# Patient Record
Sex: Male | Born: 1965 | Race: White | Hispanic: No | Marital: Married | Smoking: Current every day smoker
Health system: Southern US, Community
[De-identification: ages and names within clinical notes are randomized; demographics above are authoritative.]

## PROBLEM LIST (undated history)

## (undated) DIAGNOSIS — G35 Multiple sclerosis: Secondary | ICD-10-CM

## (undated) DIAGNOSIS — R5383 Other fatigue: Secondary | ICD-10-CM

## (undated) HISTORY — DX: Multiple sclerosis: G35

## (undated) HISTORY — DX: Other fatigue: R53.83

## (undated) HISTORY — PX: BACK SURGERY: SHX140

---

## 2011-06-23 DIAGNOSIS — G35 Multiple sclerosis: Secondary | ICD-10-CM | POA: Diagnosis not present

## 2011-07-23 DIAGNOSIS — G35 Multiple sclerosis: Secondary | ICD-10-CM | POA: Diagnosis not present

## 2011-09-17 DIAGNOSIS — G35 Multiple sclerosis: Secondary | ICD-10-CM | POA: Diagnosis not present

## 2011-10-20 DIAGNOSIS — G35 Multiple sclerosis: Secondary | ICD-10-CM | POA: Diagnosis not present

## 2011-10-20 DIAGNOSIS — R5381 Other malaise: Secondary | ICD-10-CM | POA: Diagnosis not present

## 2011-11-24 DIAGNOSIS — G35 Multiple sclerosis: Secondary | ICD-10-CM | POA: Diagnosis not present

## 2011-12-22 DIAGNOSIS — G35 Multiple sclerosis: Secondary | ICD-10-CM | POA: Diagnosis not present

## 2012-01-18 DIAGNOSIS — G35 Multiple sclerosis: Secondary | ICD-10-CM | POA: Diagnosis not present

## 2012-01-22 DIAGNOSIS — L0291 Cutaneous abscess, unspecified: Secondary | ICD-10-CM | POA: Diagnosis not present

## 2012-01-22 DIAGNOSIS — L0390 Cellulitis, unspecified: Secondary | ICD-10-CM | POA: Diagnosis not present

## 2012-02-15 DIAGNOSIS — G35 Multiple sclerosis: Secondary | ICD-10-CM | POA: Diagnosis not present

## 2012-03-20 DIAGNOSIS — G35 Multiple sclerosis: Secondary | ICD-10-CM | POA: Diagnosis not present

## 2012-04-17 DIAGNOSIS — G35 Multiple sclerosis: Secondary | ICD-10-CM | POA: Diagnosis not present

## 2012-05-22 DIAGNOSIS — G35 Multiple sclerosis: Secondary | ICD-10-CM | POA: Diagnosis not present

## 2012-06-16 DIAGNOSIS — G35 Multiple sclerosis: Secondary | ICD-10-CM | POA: Diagnosis not present

## 2012-07-03 DIAGNOSIS — F411 Generalized anxiety disorder: Secondary | ICD-10-CM | POA: Diagnosis not present

## 2012-07-03 DIAGNOSIS — R209 Unspecified disturbances of skin sensation: Secondary | ICD-10-CM | POA: Diagnosis not present

## 2012-07-03 DIAGNOSIS — G35 Multiple sclerosis: Secondary | ICD-10-CM | POA: Diagnosis not present

## 2012-07-07 DIAGNOSIS — G35D Multiple sclerosis, unspecified: Secondary | ICD-10-CM | POA: Diagnosis not present

## 2012-07-07 DIAGNOSIS — G35 Multiple sclerosis: Secondary | ICD-10-CM | POA: Diagnosis not present

## 2012-07-07 DIAGNOSIS — R209 Unspecified disturbances of skin sensation: Secondary | ICD-10-CM | POA: Diagnosis not present

## 2012-07-07 DIAGNOSIS — M5030 Other cervical disc degeneration, unspecified cervical region: Secondary | ICD-10-CM | POA: Diagnosis not present

## 2012-07-17 DIAGNOSIS — G35 Multiple sclerosis: Secondary | ICD-10-CM | POA: Diagnosis not present

## 2012-08-14 DIAGNOSIS — G35 Multiple sclerosis: Secondary | ICD-10-CM | POA: Diagnosis not present

## 2012-08-21 DIAGNOSIS — M549 Dorsalgia, unspecified: Secondary | ICD-10-CM | POA: Diagnosis not present

## 2012-08-21 DIAGNOSIS — G8929 Other chronic pain: Secondary | ICD-10-CM | POA: Diagnosis not present

## 2012-08-21 DIAGNOSIS — F17200 Nicotine dependence, unspecified, uncomplicated: Secondary | ICD-10-CM | POA: Diagnosis not present

## 2012-08-21 DIAGNOSIS — G35 Multiple sclerosis: Secondary | ICD-10-CM | POA: Diagnosis not present

## 2012-08-23 DIAGNOSIS — F17200 Nicotine dependence, unspecified, uncomplicated: Secondary | ICD-10-CM | POA: Diagnosis not present

## 2012-08-23 DIAGNOSIS — A4902 Methicillin resistant Staphylococcus aureus infection, unspecified site: Secondary | ICD-10-CM | POA: Diagnosis not present

## 2012-08-23 DIAGNOSIS — Z5189 Encounter for other specified aftercare: Secondary | ICD-10-CM | POA: Diagnosis not present

## 2012-08-23 DIAGNOSIS — Z880 Allergy status to penicillin: Secondary | ICD-10-CM | POA: Diagnosis not present

## 2012-08-23 DIAGNOSIS — G35 Multiple sclerosis: Secondary | ICD-10-CM | POA: Diagnosis not present

## 2012-09-15 DIAGNOSIS — G35 Multiple sclerosis: Secondary | ICD-10-CM | POA: Diagnosis not present

## 2012-10-16 DIAGNOSIS — G35 Multiple sclerosis: Secondary | ICD-10-CM | POA: Diagnosis not present

## 2012-11-14 DIAGNOSIS — G35 Multiple sclerosis: Secondary | ICD-10-CM | POA: Diagnosis not present

## 2012-11-14 DIAGNOSIS — N529 Male erectile dysfunction, unspecified: Secondary | ICD-10-CM | POA: Diagnosis not present

## 2012-11-14 DIAGNOSIS — R5383 Other fatigue: Secondary | ICD-10-CM | POA: Diagnosis not present

## 2012-11-14 DIAGNOSIS — M47817 Spondylosis without myelopathy or radiculopathy, lumbosacral region: Secondary | ICD-10-CM | POA: Diagnosis not present

## 2012-12-13 DIAGNOSIS — G35 Multiple sclerosis: Secondary | ICD-10-CM | POA: Diagnosis not present

## 2013-01-16 DIAGNOSIS — G35 Multiple sclerosis: Secondary | ICD-10-CM | POA: Diagnosis not present

## 2013-02-02 DIAGNOSIS — R5381 Other malaise: Secondary | ICD-10-CM | POA: Diagnosis not present

## 2013-02-02 DIAGNOSIS — M47817 Spondylosis without myelopathy or radiculopathy, lumbosacral region: Secondary | ICD-10-CM | POA: Diagnosis not present

## 2013-02-02 DIAGNOSIS — G35 Multiple sclerosis: Secondary | ICD-10-CM | POA: Diagnosis not present

## 2013-02-14 DIAGNOSIS — G35 Multiple sclerosis: Secondary | ICD-10-CM | POA: Diagnosis not present

## 2013-03-19 DIAGNOSIS — R5381 Other malaise: Secondary | ICD-10-CM | POA: Diagnosis not present

## 2013-03-19 DIAGNOSIS — G35 Multiple sclerosis: Secondary | ICD-10-CM | POA: Diagnosis not present

## 2013-04-18 DIAGNOSIS — G35 Multiple sclerosis: Secondary | ICD-10-CM | POA: Diagnosis not present

## 2013-05-16 DIAGNOSIS — M47817 Spondylosis without myelopathy or radiculopathy, lumbosacral region: Secondary | ICD-10-CM | POA: Diagnosis not present

## 2013-05-16 DIAGNOSIS — G35 Multiple sclerosis: Secondary | ICD-10-CM | POA: Diagnosis not present

## 2013-05-16 DIAGNOSIS — R5381 Other malaise: Secondary | ICD-10-CM | POA: Diagnosis not present

## 2013-05-16 DIAGNOSIS — F411 Generalized anxiety disorder: Secondary | ICD-10-CM | POA: Diagnosis not present

## 2013-06-18 DIAGNOSIS — G35 Multiple sclerosis: Secondary | ICD-10-CM | POA: Diagnosis not present

## 2013-07-17 DIAGNOSIS — G35 Multiple sclerosis: Secondary | ICD-10-CM | POA: Diagnosis not present

## 2013-08-16 DIAGNOSIS — G35 Multiple sclerosis: Secondary | ICD-10-CM | POA: Diagnosis not present

## 2013-09-19 DIAGNOSIS — R5381 Other malaise: Secondary | ICD-10-CM | POA: Diagnosis not present

## 2013-09-19 DIAGNOSIS — G35 Multiple sclerosis: Secondary | ICD-10-CM | POA: Diagnosis not present

## 2013-09-19 DIAGNOSIS — M25559 Pain in unspecified hip: Secondary | ICD-10-CM | POA: Diagnosis not present

## 2013-09-19 DIAGNOSIS — M47817 Spondylosis without myelopathy or radiculopathy, lumbosacral region: Secondary | ICD-10-CM | POA: Diagnosis not present

## 2013-09-19 DIAGNOSIS — R5383 Other fatigue: Secondary | ICD-10-CM | POA: Diagnosis not present

## 2013-09-27 DIAGNOSIS — M25559 Pain in unspecified hip: Secondary | ICD-10-CM | POA: Diagnosis not present

## 2013-09-27 DIAGNOSIS — M1610 Unilateral primary osteoarthritis, unspecified hip: Secondary | ICD-10-CM | POA: Diagnosis not present

## 2013-09-27 DIAGNOSIS — M169 Osteoarthritis of hip, unspecified: Secondary | ICD-10-CM | POA: Diagnosis not present

## 2013-10-15 DIAGNOSIS — G35 Multiple sclerosis: Secondary | ICD-10-CM | POA: Diagnosis not present

## 2013-11-15 DIAGNOSIS — G35 Multiple sclerosis: Secondary | ICD-10-CM | POA: Diagnosis not present

## 2013-12-17 DIAGNOSIS — G35 Multiple sclerosis: Secondary | ICD-10-CM | POA: Diagnosis not present

## 2014-01-14 DIAGNOSIS — G35 Multiple sclerosis: Secondary | ICD-10-CM | POA: Diagnosis not present

## 2014-02-15 DIAGNOSIS — G35 Multiple sclerosis: Secondary | ICD-10-CM | POA: Diagnosis not present

## 2014-03-18 DIAGNOSIS — G35 Multiple sclerosis: Secondary | ICD-10-CM | POA: Diagnosis not present

## 2014-03-27 DIAGNOSIS — G35 Multiple sclerosis: Secondary | ICD-10-CM | POA: Diagnosis not present

## 2014-04-16 DIAGNOSIS — G35 Multiple sclerosis: Secondary | ICD-10-CM | POA: Diagnosis not present

## 2014-05-16 DIAGNOSIS — G35 Multiple sclerosis: Secondary | ICD-10-CM | POA: Diagnosis not present

## 2014-06-20 DIAGNOSIS — G35 Multiple sclerosis: Secondary | ICD-10-CM | POA: Diagnosis not present

## 2014-06-24 ENCOUNTER — Encounter: Payer: Self-pay | Admitting: Neurology

## 2014-06-24 ENCOUNTER — Ambulatory Visit (INDEPENDENT_AMBULATORY_CARE_PROVIDER_SITE_OTHER): Payer: Medicare Other | Admitting: Neurology

## 2014-06-24 VITALS — BP 154/92 | HR 74 | Resp 16 | Ht 68.0 in | Wt 153.0 lb

## 2014-06-24 DIAGNOSIS — M5441 Lumbago with sciatica, right side: Secondary | ICD-10-CM

## 2014-06-24 DIAGNOSIS — G35 Multiple sclerosis: Secondary | ICD-10-CM

## 2014-06-24 DIAGNOSIS — R5382 Chronic fatigue, unspecified: Secondary | ICD-10-CM

## 2014-06-24 DIAGNOSIS — R5383 Other fatigue: Secondary | ICD-10-CM | POA: Insufficient documentation

## 2014-06-24 DIAGNOSIS — R260 Ataxic gait: Secondary | ICD-10-CM | POA: Diagnosis not present

## 2014-06-24 DIAGNOSIS — F909 Attention-deficit hyperactivity disorder, unspecified type: Secondary | ICD-10-CM | POA: Diagnosis not present

## 2014-06-24 DIAGNOSIS — F988 Other specified behavioral and emotional disorders with onset usually occurring in childhood and adolescence: Secondary | ICD-10-CM

## 2014-06-24 HISTORY — DX: Other fatigue: R53.83

## 2014-06-24 HISTORY — DX: Multiple sclerosis: G35

## 2014-06-24 MED ORDER — METHYLPHENIDATE HCL 10 MG PO TABS: 10.0000 mg | ORAL_TABLET | Freq: Two times a day (BID) | ORAL | Status: DC

## 2014-06-24 NOTE — Progress Notes (Signed)
[facility]  PATIENT: Zachary Rasmussen DOB: 08/07/1965  REFERRING CLINICIAN: No primary care physician HISTORY FROM: Patient REASON FOR VISIT: Multiple sclerosis and worsening symptoms    HISTORICAL  CHIEF COMPLAINT:  Chief Complaint  Patient presents with  . Multiple Sclerosis    HISTORY OF PRESENT ILLNESS:  He is a 49 yo man with multiple sclerosis who was diagnosed about 10 years ago.   He presented initially with optic neuritis on the right. He got much better but continues to have some blurry vision out of the right eye. About the same time, he also began to notice that his gait was a little off balance. Although the vision has pretty much been stable, the gait has worsened over the years. He also has had worsening difficulties with attention and with verbal fluency and with memory.    He also notes numbness in the left face with tingling.    He reports difficulty with fatigue and gets benefit from IV steroid.   However, over the last year, the benefit has been less.   He got more benefit from Ritalin .  He did misuse it (taking higher doses in the past and then running out early) and it was discontinued a few years ago.     He was initially placed on Betaseron.   He has tolerated Betaseron well. Last year he switched to Plegridy to have fewer number of injections. While on these medicines he has not had any clearcut exacerbations though he has had some progression of his symptoms.    He has had more difficulty with coordination and with memory/speech lately.  He has fallen a few times.  He feels off balanced.    He notes numbness in the left face.  It feels like Novocaine wearing out.   He also has right foot numbness that reportedly appeared after lumbar spine surgery.    He denies numbness elsewhere.      He reports some depression at times but not bad enough to treat.   He denies any anxiety.      He notes some bladder urgency but has not had any  incontinence.   He denies nocturia or any UTI's.   No bowel issues.   He has had more issues with cognition noting poor attention and much more word finding difficulty.   This has slowly worsened over the past couple years.    REVIEW OF SYSTEMS:  Constitutional: No fevers, chills, sweats, or change in appetite Eyes: No new visual changes, double vision, eye pain Ear, nose and throat: No hearing loss, ear pain, nasal congestion, sore throat Cardiovascular: No chest pain, palpitations Respiratory:  No shortness of breath at rest or with exertion.   No wheezes GastrointestinaI: No nausea, vomiting, diarrhea, abdominal pain, fecal incontinence Genitourinary:  See above    No nocturia. Musculoskeletal:  No neck pan.   He continues to have back pain even after surgery.   Integumentary: No rash, pruritus, skin lesions Neurological: as above Psychiatric: see above. Endocrine: No palpitations, diaphoresis, change in appetite, change in weigh or increased thirst Hematologic/Lymphatic:  No anemia, purpura, petechiae. Allergic/Immunologic: No itchy/runny eyes, nasal congestion, recent allergic reactions, rashes  ALLERGIES: Allergies  Allergen Reactions  . Penicillins Rash    HOME MEDICATIONS: See Med List  PAST MEDICAL HISTORY: Past Medical History  Diagnosis Date  . Multiple sclerosis 06/24/2014  . Fatigue 06/24/2014    PAST SURGICAL HISTORY: Past Surgical History  Procedure Laterality Date  . Back  surgery      FAMILY HISTORY: Family History  Problem Relation Age of Onset  . Rheum arthritis Father   . Asthma Son   . Cancer Paternal Grandfather   . Heart failure Son   . Hyperlipidemia Father   . Hypertension Father   . Hypertension Mother   . Thyroid disease Neg Hx   . Stroke Neg Hx   . Seizures Neg Hx   . Rashes / Skin problems Neg Hx   . Migraines Neg Hx   . Diabetes Neg Hx     SOCIAL HISTORY:  History   Social History  . Marital Status: Single    Spouse Name:  N/A    Number of Children: N/A  . Years of Education: N/A   Occupational History  . Not on file.   Social History Main Topics  . Smoking status: Current Every Day Smoker -- 0.75 packs/day for 35 years    Types: Cigarettes  . Smokeless tobacco: Not on file  . Alcohol Use: Not on file  . Drug Use: No  . Sexual Activity: Not on file   Other Topics Concern  . Not on file   Social History Narrative  . No narrative on file     PHYSICAL EXAM  Filed Vitals:   06/24/14 1446  BP: 154/92  Pulse: 74  Resp: 16  Height:  (1.727 m)  Weight: 153 lb (69.4 kg)    Body mass index is 23.27 kg/(m^2).   General: The patient is well-developed and well-nourished and in no acute distress  Eyes:  Funduscopic exam shows normal optic discs and retinal vessels.  Neck: The neck is supple, no carotid bruits are noted.  The neck is nontender.  Back:  He is postoperative. Range of motion is normal. He has tenderness in the lower lumbar paraspinal region.  Respiratory: The respiratory examination is clear.  Cardiovascular: The cardiovascular examination reveals a regular rate and rhythm, no murmurs, gallops or rubs are noted.  Skin: Extremities are without significant edema.  Neurologic Exam  Mental status: The patient is alert and oriented x 3 at the time of the examination. The patient has apparent normal recent and remote memory, with an apparently normal attention span and concentration ability.   Speech is normal.  Cranial nerves: Extraocular movements are full. Pupils are equal, round, and reactive to light and accomodation.  Visual fields are full.  Facial symmetry is present. There is good facial sensation to soft touch bilaterally.Facial strength is normal.  Trapezius and sternocleidomastoid strength is normal. No dysarthria is noted.  The tongue is midline, and the patient has symmetric elevation of the soft palate. No obvious hearing deficits are noted.  Motor:  Muscle bulk is  normal. Tone is mildly increased in the legs. Strength is  5 / 5 in all 4 extremities.   Sensory: Sensory testing is intact to pinprick, soft touch, vibration sensation, and position sense on all 4 extremities.  Coordination: Cerebellar testing reveals good finger-nose-finger and heel-to-shin bilaterally.  Gait and station: Station is stable.  The gait is mildly wide and the tandem gait is moderately wide.. Romberg is negative.   Reflexes: Deep tendon reflexes are symmetric and normal bilaterally. Plantar responses are normal.    DIAGNOSTIC DATA (LABS, IMAGING, TESTING) - I reviewed patient records, labs, notes, testing and imaging myself where available.     ASSESSMENT AND PLAN  In summary, Mr. Barton is a 49 year old man with multiple sclerosis for the past 10 years.  His main impairments include an off-balance gait and fatigue. More recently he has also had cognitive issues that have progressed.  I will try to get the actual MRI images from last year with a report as these were not available at the time of the visit. He has shown significant progression, switching from Plegridy to another agent would be reasonable. We briefly discussed the oral agents as he would prefer to go to a pill if he needs to switch. In the past, his fatigue and cognitive attentional defects improved with Ritalin. We had a long discussion about his misuse of medication in the past. At that time, he had a log of personal issues with his family and he feels he is in a much better place at this point. His wife will be able to oversee his medicine use. I will give him one more chance and have prescribed Ritalin 10 mg by mouth twice a day. He just received a prescription for his oxycodone for his lower back pain and he will continue that as well.  He will return to see [place] in 4 months, sooner if he has more MS related issues. He was also advised to find a primary care physician  Richard A. Epimenio Foot, MD, PhD 06/24/2014, 3:22  PM Certified in Neurology, Clinical Neurophysiology, Sleep Medicine, Pain Medicine and Neuroimaging  [facility] [address] [place], [place] [address] [phone]

## 2014-07-17 ENCOUNTER — Telehealth: Payer: Self-pay

## 2014-07-17 ENCOUNTER — Other Ambulatory Visit: Payer: Self-pay | Admitting: Neurology

## 2014-07-17 MED ORDER — OXYCODONE-ACETAMINOPHEN 10-325 MG PO TABS: 1.0000 | ORAL_TABLET | ORAL | Status: DC | PRN

## 2014-07-17 NOTE — Telephone Encounter (Signed)
Patient states he needs Rx refill for oxycodone and ritalin. I went over last OV plan with patient and offered to schedule 4 month office visit. Patient insisted message given to Faith and request she calls back with refill and appt info because he is currently driving.

## 2014-07-17 NOTE — Telephone Encounter (Signed)
Spoke with Zachary Rasmussen and advised that 1--IV steroids should be limited as much as possible to preserve bone health, and 2--per [facility], oxycodone is due 07-20-14--rx will be up front on or after that day for him to pick up.  2-6 is a Saturday--pt is aware [facility] is not open on Saturdays, so rx would be available the following Monday--07-22-14.  He is agreeable with this/fim

## 2014-07-24 ENCOUNTER — Other Ambulatory Visit: Payer: Self-pay | Admitting: Neurology

## 2014-07-24 MED ORDER — METHYLPHENIDATE HCL 10 MG PO TABS: 10.0000 mg | ORAL_TABLET | Freq: Two times a day (BID) | ORAL | Status: DC

## 2014-07-24 NOTE — Telephone Encounter (Signed)
Pt is calling requesting a written Rx for methylphenidate (RITALIN) 10 MG tablet. Please call when ready for pick up. °

## 2014-07-24 NOTE — Telephone Encounter (Signed)
Request entered, forwarded to provider for approval.  

## 2014-07-25 ENCOUNTER — Telehealth: Payer: Self-pay | Admitting: *Deleted

## 2014-07-25 NOTE — Telephone Encounter (Signed)
LMOM that Ritalin rx. is up front, ready to be p/u tomorrow.  Advised office closes at noon on Fridays/fim

## 2014-07-25 NOTE — Telephone Encounter (Signed)
Patient is calling to get a written Rx for Ritalin.

## 2014-08-19 ENCOUNTER — Other Ambulatory Visit: Payer: Self-pay | Admitting: Neurology

## 2014-08-19 MED ORDER — METHYLPHENIDATE HCL 10 MG PO TABS: 10.0000 mg | ORAL_TABLET | Freq: Two times a day (BID) | ORAL | Status: DC

## 2014-08-19 MED ORDER — OXYCODONE-ACETAMINOPHEN 10-325 MG PO TABS: 1.0000 | ORAL_TABLET | ORAL | Status: DC | PRN

## 2014-08-19 NOTE — Telephone Encounter (Signed)
Pt calling to request refill for oxyCODONE-acetaminophen (PERCOCET) 10-325 MG per tablet and methylphenidate (RITALIN) 10 MG tablet. Please call when ready for pick up.

## 2014-08-20 ENCOUNTER — Telehealth: Payer: Self-pay

## 2014-08-20 NOTE — Telephone Encounter (Signed)
Called patient and informed Rx ready for pick up at front desk. Patient verbalized understanding.  

## 2014-09-16 ENCOUNTER — Telehealth: Payer: Self-pay | Admitting: Neurology

## 2014-09-16 NOTE — Telephone Encounter (Signed)
Patient stated PA is needed for Rx Peginterferon Beta-1a (PLEGRIDY) 125 MCG/0.5ML SOPN.  Please call and advise.

## 2014-09-16 NOTE — Telephone Encounter (Signed)
Ins has been contacted and provided with clinical info.  Request is currently under review Ref Key: [id number].  I called the patient back to advise.  He is aware.

## 2014-09-17 ENCOUNTER — Telehealth: Payer: Self-pay

## 2014-09-17 NOTE — Telephone Encounter (Signed)
[employer] has approved the request for coverage on Plegridy effective until 09/16/2016 Ref # [id number]

## 2014-09-18 ENCOUNTER — Telehealth: Payer: Self-pay

## 2014-09-18 MED ORDER — OXYCODONE-ACETAMINOPHEN 10-325 MG PO TABS: 1.0000 | ORAL_TABLET | ORAL | Status: DC | PRN

## 2014-09-18 MED ORDER — METHYLPHENIDATE HCL 10 MG PO TABS: 10.0000 mg | ORAL_TABLET | Freq: Two times a day (BID) | ORAL | Status: DC

## 2014-09-18 NOTE — Telephone Encounter (Signed)
Patient is requesting a refill on Percocet and Ritalin.  As well, he would like to discuss scheduling a follow up appt.  Thank you.

## 2014-09-18 NOTE — Telephone Encounter (Signed)
Spoke with Iantha Fallen and sched. f/u appt. next week per his request.  Advised rx's have been printed, signed, are up front [facility] to be picked up/fim

## 2014-09-24 ENCOUNTER — Encounter: Payer: Self-pay | Admitting: Neurology

## 2014-09-24 ENCOUNTER — Ambulatory Visit (INDEPENDENT_AMBULATORY_CARE_PROVIDER_SITE_OTHER): Payer: Medicare Other | Admitting: Neurology

## 2014-09-24 VITALS — BP 140/88 | HR 66 | Resp 14 | Ht 68.0 in | Wt 142.2 lb

## 2014-09-24 DIAGNOSIS — F09 Unspecified mental disorder due to known physiological condition: Secondary | ICD-10-CM

## 2014-09-24 DIAGNOSIS — G35 Multiple sclerosis: Secondary | ICD-10-CM | POA: Diagnosis not present

## 2014-09-24 DIAGNOSIS — M545 Low back pain, unspecified: Secondary | ICD-10-CM | POA: Insufficient documentation

## 2014-09-24 DIAGNOSIS — R5383 Other fatigue: Secondary | ICD-10-CM | POA: Diagnosis not present

## 2014-09-24 DIAGNOSIS — R260 Ataxic gait: Secondary | ICD-10-CM | POA: Insufficient documentation

## 2014-09-24 DIAGNOSIS — F418 Other specified anxiety disorders: Secondary | ICD-10-CM | POA: Diagnosis not present

## 2014-09-24 DIAGNOSIS — R200 Anesthesia of skin: Secondary | ICD-10-CM | POA: Insufficient documentation

## 2014-09-24 MED ORDER — METHYLPHENIDATE HCL 10 MG PO TABS: 10.0000 mg | ORAL_TABLET | Freq: Three times a day (TID) | ORAL | Status: DC | PRN

## 2014-09-24 MED ORDER — OXYCODONE-ACETAMINOPHEN 10-325 MG PO TABS: 1.0000 | ORAL_TABLET | ORAL | Status: DC | PRN

## 2014-09-24 NOTE — Progress Notes (Signed)
[facility]  PATIENT: Zachary Rasmussen DOB: 01/08/1966  REFERRING CLINICIAN: No primary care physician HISTORY FROM: Patient REASON FOR VISIT: Multiple sclerosis and worsening symptoms    HISTORICAL  CHIEF COMPLAINT:  Chief Complaint  Patient presents with  . Multiple Sclerosis    Sts. he is out of Plegridy--so far only 2 days late--due to lack of funding.  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  Sts. no change in MS sx./fim    HISTORY OF PRESENT ILLNESS:  He is a 49 yo man with multiple sclerosis who was diagnosed about 10 years ago.   He presented initially with optic neuritis on the right. He got much better but continues to have some blurry vision out of the right eye. About the same time, he also began to notice that his gait was a little off balance. Although the vision has pretty much been stable, the gait has worsened over the years. He also has had worsening difficulties with attention and with verbal fluency and with memory.    He also notes numbness in the left face with tingling.    He reports difficulty with fatigue and gets benefit from IV steroid.   However, over the last year, the benefit has been less.   He got more benefit from Ritalin .  He did misuse it (taking higher doses in the past and then running out early) and it was discontinued a few years ago.   He was initially placed on Betaseron.   He has tolerated Betaseron well. Last year he switched to Plegridy to have fewer number of injections. While on these medicines he has not had any clearcut exacerbations though he has had some progression of his symptoms.    New issues:    He notes his voice is more slurred lately.    This seems worse in the afternoons and evenings.     Gait/strength/sensation:   He feels gait does ok most of the time but balance is off a bit.   He stumbles at times but does not fall.    He has had more difficulty with coordination at times.   Feels strength is  usually good.   He notes numbness in the left face.  It feels like Novocaine wearing out.   He also has right foot numbness that reportedly appeared after lumbar spine surgery.    He denies numbness elsewhere.      Vision:    He notes vision has slowly declined over the past few years.   Has not seen ophthalmologist x 5 or more years.     Fatigue/sleep:    He notes more difficulty with fatigue, especially in the afternoons when he has more trouble getting tasks started and completed.    Fatigue is both physical and mental.   He feels he gets some some benefit from ritalin  Bladder:   He denies hesitancy but has mild frequency.    He denies nocturia or any UTI's.   No bowel issues.   Mood/Cognitoin:   He reports mild depression at times but not bad enough to treat.   He notes more anxiety lately having financial issues and less ability to do anything about it.    He has had more issues with cognition noting poor attention and much more word finding difficulty.   He feels ritalin has helped the cognitive issues during the morning but has worn off by the afternoon.  Pain: He continues to report moderate  pain in the lower back more than the neck. He feels that oxycodone has significantly helped the pain.  REVIEW OF SYSTEMS:  Constitutional: No fevers, chills, sweats, or change in appetite Eyes: No new visual changes, double vision, eye pain Ear, nose and throat: No hearing loss, ear pain, nasal congestion, sore throat Cardiovascular: No chest pain, palpitations Respiratory:  No shortness of breath at rest or with exertion.   No wheezes GastrointestinaI: No nausea, vomiting, diarrhea, abdominal pain, fecal incontinence Genitourinary:  See above    No nocturia. Musculoskeletal:  No neck pan.   He continues to have back pain even after surgery.   Integumentary: No rash, pruritus, skin lesions Neurological: as above Psychiatric: see above. Endocrine: No palpitations, diaphoresis, change in appetite,  change in weigh or increased thirst Hematologic/Lymphatic:  No anemia, purpura, petechiae. Allergic/Immunologic: No itchy/runny eyes, nasal congestion, recent allergic reactions, rashes  ALLERGIES: Allergies  Allergen Reactions  . Penicillins Rash    HOME MEDICATIONS: See Med List  PAST MEDICAL HISTORY: Past Medical History  Diagnosis Date  . Multiple sclerosis 06/24/2014  . Fatigue 06/24/2014    PAST SURGICAL HISTORY: Past Surgical History  Procedure Laterality Date  . Back surgery      FAMILY HISTORY: Family History  Problem Relation Age of Onset  . Rheum arthritis Father   . Asthma Son   . Cancer Paternal Grandfather   . Heart failure Son   . Hyperlipidemia Father   . Hypertension Father   . Hypertension Mother   . Thyroid disease Neg Hx   . Stroke Neg Hx   . Seizures Neg Hx   . Rashes / Skin problems Neg Hx   . Migraines Neg Hx   . Diabetes Neg Hx     SOCIAL HISTORY:  History   Social History  . Marital Status: Single    Spouse Name: N/A  . Number of Children: N/A  . Years of Education: N/A   Occupational History  . Not on file.   Social History Main Topics  . Smoking status: Current Every Day Smoker -- 0.75 packs/day for 35 years    Types: Cigarettes  . Smokeless tobacco: Not on file  . Alcohol Use: Not on file  . Drug Use: No  . Sexual Activity: Not on file   Other Topics Concern  . Not on file   Social History Narrative     PHYSICAL EXAM  Filed Vitals:   09/24/14 1320  BP: 140/88  Pulse: 66  Resp: 14  Height:  (1.727 m)  Weight: 142 lb 3.2 oz (64.501 kg)    Body mass index is 21.63 kg/(m^2).   General: The patient is well-developed and well-nourished and in no acute distress  Neck: The neck is supple, no carotid bruits are noted.  The neck is nontender.  Back:  He is postoperative. Range of motion is normal. He has tenderness in the lower lumbar paraspinal region.  Neurologic Exam  Mental status: The patient is  alert and oriented x 3 at the time of the examination. The patient has apparent normal recent and remote memory, with an apparently normal attention span and concentration ability.   Speech is normal.  Cranial nerves: Extraocular movements are full. Pupils are equal, round, and reactive to light and accomodation.  Visual fields are full.  Facial symmetry is present. There is reduced left facial sensation \.Facial strength is normal.  rapezius and sternocleidomastoid strength is normal. No dysarthria is noted.  The tongue is midline,  and the patient has symmetric elevation of the soft palate. No obvious hearing deficits are noted.  Motor:  Muscle bulk is normal. Tone is mildly increased in the legs. Strength is  5 / 5 in all 4 extremities.   Sensory: Sensory testing is intact to touch on all 4 extremities.  Coordination: Cerebellar testing reveals good finger-nose-finger and heel-to-shin bilaterally.  Gait and station: Station is stable.  The gait is mildly wide and the tandem gait is moderately wide.. Romberg is negative.   Reflexes: Deep tendon reflexes are symmetric and normal bilaterally. Plantar responses are normal.    DIAGNOSTIC DATA (LABS, IMAGING, TESTING) - I reviewed patient records, labs, notes, testing and imaging myself where available.     ASSESSMENT AND PLAN  Multiple sclerosis  Other fatigue  Ataxic gait  Numbness  Cognitive dysfunction  Depression with anxiety  Midline low back pain without sciatica     1.   Refill methylphenidate and oxycodone 2.   He will call the Plegridy program -- any problems, let us know  So we can see if anything we can do 3.   Stay active 4.  rtc 4 months, sooner if problems.    We will need to check an MRI at his next visit to determine if he is having any subclinical progression of his MS. If this is occurring I will reconsider his treatment options.  Also recommended to see an ophthalmologist soon.  Richard A. Epimenio Foot, MD, PhD  09/24/2014, 1:25 PM Certified in Neurology, Clinical Neurophysiology, Sleep Medicine, Pain Medicine and Neuroimaging  [facility] [address] [place], [place] [address] [phone]

## 2014-10-01 ENCOUNTER — Other Ambulatory Visit: Payer: Self-pay | Admitting: Neurology

## 2014-10-01 DIAGNOSIS — Z139 Encounter for screening, unspecified: Secondary | ICD-10-CM

## 2014-10-03 ENCOUNTER — Telehealth: Payer: Self-pay | Admitting: Neurology

## 2014-10-03 NOTE — Telephone Encounter (Signed)
Calling for financial  Assistance on the Rx. PLETRIDY for patient Zachary Rasmussen.  Did not get phone number for [facility]. DW

## 2014-10-03 NOTE — Telephone Encounter (Signed)
No phone number was taken for [place] to return the call.  (If Orpah Clinton calls back please obtain phone number) I called [employer]/product designer for [employer]) at [phone].  Spoke with Deanna.  She reviewed the patient file and said they did not call, and verified nothing is needed from [place].  Says the patient was already set up to receive meds.  She has noted file that we called, and they will call back if needed.

## 2014-10-09 ENCOUNTER — Other Ambulatory Visit: Payer: Self-pay

## 2014-10-09 MED ORDER — PEGINTERFERON BETA-1A 125 MCG/0.5ML ~~LOC~~ SOPN: 125.0000 ug | PEN_INJECTOR | SUBCUTANEOUS | Status: DC

## 2014-10-09 NOTE — Telephone Encounter (Signed)
I called the patient back.  He said he was told to call and schedule his shipment last week but he forgot to, and when he called today, he was told they were not able to process the request.  I called [employer].  Spoke with Fayrene Fearing.  He said the patient was approved for financial assistance, and everything is set up on their end.  He placed me on hold while he called [employer].  Says he provided them with the billing info for the assistance approval.  They asked that we send a refill, which I have done.  They will process this order, and patient should be able to call tomorrow to set up shipment.  I called the patient back.  He will contact [employer] tomorrow and call us back if anything further is needed.

## 2014-10-09 NOTE — Telephone Encounter (Signed)
Patient called following up on the message from  10/03/14. Please call and advice #[phone]

## 2014-11-12 ENCOUNTER — Telehealth: Payer: Self-pay | Admitting: Neurology

## 2014-11-12 MED ORDER — OXYCODONE-ACETAMINOPHEN 10-325 MG PO TABS: 1.0000 | ORAL_TABLET | ORAL | Status: DC | PRN

## 2014-11-12 MED ORDER — METHYLPHENIDATE HCL 10 MG PO TABS: 10.0000 mg | ORAL_TABLET | Freq: Three times a day (TID) | ORAL | Status: DC | PRN

## 2014-11-12 NOTE — Telephone Encounter (Signed)
Patient called and requested refill on Rx. oxyCODONE-acetaminophen (PERCOCET) 10-325 MG per tablet and Rx. methylphenidate (RITALIN) 10 MG tablet. Informed the patient they would be ready for pick up within 24 hours unless informed otherwise from the nurse.

## 2014-11-12 NOTE — Telephone Encounter (Signed)
Rx's printed, signed, up front [facility]/fim 

## 2014-11-14 ENCOUNTER — Telehealth: Payer: Self-pay | Admitting: Neurology

## 2014-11-14 NOTE — Telephone Encounter (Signed)
Ritalin and Oxycodone rx's' were printed, placed up front [facility] on 11-12-14 for Zachary Rasmussen to pick up/fim

## 2014-11-14 NOTE — Telephone Encounter (Signed)
Zachary Rasmussen with [facility] called regarding script formethylphenidate (RITALIN) 10 MG tablet and  oxyCODONE-acetaminophen (PERCOCET) 10-325 MG per tablet. She states the pharmacy is closed on Sunday and is inquiring if scripts can be approved to be filled on Saturday. [name] can be reached at [phone]. Please call and advise.

## 2014-12-12 ENCOUNTER — Other Ambulatory Visit: Payer: Self-pay | Admitting: Neurology

## 2014-12-12 MED ORDER — METHYLPHENIDATE HCL 10 MG PO TABS
10.0000 mg | ORAL_TABLET | Freq: Three times a day (TID) | ORAL | Status: DC | PRN
Start: 2014-12-12 — End: 2015-01-09

## 2014-12-12 MED ORDER — OXYCODONE-ACETAMINOPHEN 10-325 MG PO TABS: 1.0000 | ORAL_TABLET | ORAL | Status: DC | PRN

## 2014-12-12 NOTE — Telephone Encounter (Signed)
Patient is calling to get written Rx's for oxyCODONE-acetaminophen (PERCOCET) 10-325 MG per tablet and methylphenidate (RITALIN) 10 MG tablet. I advised the patient the Rx will be ready in 24 hours unless the nurse advises otherwise. Patient states he does need these Rx's before 12 tomorrow. Thank you.

## 2014-12-12 NOTE — Telephone Encounter (Signed)
Request entered, forwarded to provider for review.  

## 2014-12-13 ENCOUNTER — Encounter: Payer: Self-pay | Admitting: *Deleted

## 2014-12-13 NOTE — Progress Notes (Signed)
Ritalin and Oxycodone rx's up front [facility]/fim 

## 2015-01-09 ENCOUNTER — Other Ambulatory Visit: Payer: Self-pay | Admitting: Neurology

## 2015-01-09 MED ORDER — OXYCODONE-ACETAMINOPHEN 10-325 MG PO TABS: 1.0000 | ORAL_TABLET | ORAL | Status: DC | PRN

## 2015-01-09 MED ORDER — METHYLPHENIDATE HCL 10 MG PO TABS
10.0000 mg | ORAL_TABLET | Freq: Three times a day (TID) | ORAL | Status: DC | PRN
Start: 2015-01-09 — End: 2015-01-28

## 2015-01-09 NOTE — Telephone Encounter (Signed)
Requests entered, forwarded to [facility] for review because Dr Epimenio Foot is out of the office.

## 2015-01-09 NOTE — Telephone Encounter (Signed)
Patient is calling to get written Rx's for oxyCODONE-acetaminophen (PERCOCET) 10-325 MG per tablet and methylphenidate (RITALIN) 10 MG tablet. I advised the Rx's will be ready in 24 hours unless otherwise advised by the nurse. Thank you.

## 2015-01-28 ENCOUNTER — Ambulatory Visit
Admission: RE | Admit: 2015-01-28 | Discharge: 2015-01-28 | Disposition: A | Discharge status: Home / Self Care | Payer: Medicare Other | Source: Ambulatory Visit | Attending: Neurology | Admitting: Neurology

## 2015-01-28 ENCOUNTER — Ambulatory Visit (INDEPENDENT_AMBULATORY_CARE_PROVIDER_SITE_OTHER): Payer: Medicare Other | Admitting: Neurology

## 2015-01-28 ENCOUNTER — Encounter: Payer: Self-pay | Admitting: Neurology

## 2015-01-28 VITALS — BP 138/80 | HR 78 | Resp 14 | Ht 68.0 in | Wt 142.8 lb

## 2015-01-28 DIAGNOSIS — R200 Anesthesia of skin: Secondary | ICD-10-CM

## 2015-01-28 DIAGNOSIS — G35 Multiple sclerosis: Secondary | ICD-10-CM

## 2015-01-28 DIAGNOSIS — R260 Ataxic gait: Secondary | ICD-10-CM

## 2015-01-28 DIAGNOSIS — F09 Unspecified mental disorder due to known physiological condition: Secondary | ICD-10-CM

## 2015-01-28 DIAGNOSIS — Z139 Encounter for screening, unspecified: Secondary | ICD-10-CM

## 2015-01-28 DIAGNOSIS — R5383 Other fatigue: Secondary | ICD-10-CM | POA: Diagnosis not present

## 2015-01-28 DIAGNOSIS — G35D Multiple sclerosis, unspecified: Secondary | ICD-10-CM

## 2015-01-28 DIAGNOSIS — F418 Other specified anxiety disorders: Secondary | ICD-10-CM

## 2015-01-28 DIAGNOSIS — Z135 Encounter for screening for eye and ear disorders: Secondary | ICD-10-CM | POA: Diagnosis not present

## 2015-01-28 MED ORDER — OXYCODONE-ACETAMINOPHEN 10-325 MG PO TABS: 1.0000 | ORAL_TABLET | Freq: Four times a day (QID) | ORAL | Status: DC | PRN

## 2015-01-28 MED ORDER — METHYLPHENIDATE HCL 10 MG PO TABS: 10.0000 mg | ORAL_TABLET | Freq: Three times a day (TID) | ORAL | Status: DC | PRN

## 2015-01-28 MED ORDER — GADOBENATE DIMEGLUMINE 529 MG/ML IV SOLN
14.0000 mL | Freq: Once | INTRAVENOUS | Status: AC | PRN
Start: 2015-01-28 — End: 2015-01-28
  Administered 2015-01-28: 14 mL via INTRAVENOUS

## 2015-01-28 NOTE — Progress Notes (Signed)
[facility]  PATIENT: Zachary Rasmussen DOB: [birth year]  REFERRING CLINICIAN: No primary care physician HISTORY FROM: Patient REASON FOR VISIT: Multiple sclerosis and worsening symptoms    HISTORICAL  CHIEF COMPLAINT:  Chief Complaint  Patient presents with  . Multiple Sclerosis    Sts. he continues to tolerate Plegridy well.  He denies missed doses.  Sts. fatigue is worse with heat/humidity/fim    HISTORY OF PRESENT ILLNESS:  He is a 49 yo man with multiple sclerosis .   He is currently on Plegridy and tolerates it well.    He has noted more fatigue with the recent heat.    He had an MRI today.   It does not show any enhancing foci and there is a low lesion burden.    No exacerbations lately  MS History:    He presented initially with optic neuritis on the right. He got much better but continues to have some blurry vision out of the right eye. About the same time, he also began to notice that his gait was a little off balance. Although the vision has pretty much been stable, the gait has worsened over the years. He also has had worsening difficulties with attention and with verbal fluency and with memory.    He also notes numbness in the left face with tingling.    He reports difficulty with fatigue and gets benefit from IV steroid.   However, over the last year, the benefit has been less.   He got more benefit from Ritalin .  He did misuse it (taking higher doses in the past and then running out early) and it was discontinued a few years ago.   He was initially placed on Betaseron.  and switched to Plegridy to have fewer number of injections. While on these medicines he has not had any clearcut exacerbations though he has had some progression of his symptoms.    Gait/strength/sensation:   He feels balance is off a bit and he stumbles at times but does not fall.    He feels strength is usually good.   He notes numbness in the left face (old).    The right foot numbness is  milder        Vision:    He notes vision has slowly declined over the past few years  Ut no sudden changes.   Has not seen ophthalmologist x 5 or more years.     Fatigue/sleep:    He notes more difficulty with fatigue, especially in the afternoons and on hot days.    He often has trouble getting tasks started and completed.    Fatigue is both physical and mental.   He feels he gets benefit from ritalin.   Sleep is poor some night due to difficulty falling asleep when a lot is on his mind about once a week  Bladder:   He has mild frequency.    He denies nocturia or any UTI's.   No bowel issues.   Mood/Cognitoin:   He reports mild depression  Some days but does not feel it affects him and what he does.     He notes some anxiety .  He has had more issues with cognition noting poor attention and much more word finding difficulty.   He feels Ritalin has helped the cognitive issues a little bit.  Pain: He has moderate pain in the lower back more than the neck. He feels that oxycodone has significantly helped the pain.  REVIEW OF SYSTEMS:  Constitutional: No fevers, chills, sweats, or change in appetite.  Has fatigue Eyes: No new visual changes, double vision, eye pain Ear, nose and throat: No hearing loss, ear pain, nasal congestion, sore throat Cardiovascular: No chest pain, palpitations Respiratory:  No shortness of breath at rest or with exertion.   No wheezes GastrointestinaI: No nausea, vomiting, diarrhea, abdominal pain, fecal incontinence Genitourinary:  See above    No nocturia. Musculoskeletal:  No neck pan.   He continues to have back pain even after surgery.   Integumentary: No rash, pruritus, skin lesions Neurological: as above Psychiatric: see above. Endocrine: No palpitations, diaphoresis, change in appetite, change in weigh or increased thirst Hematologic/Lymphatic:  No anemia, purpura, petechiae. Allergic/Immunologic: No itchy/runny eyes, nasal congestion, recent allergic  reactions, rashes  ALLERGIES: Allergies  Allergen Reactions  . Penicillins Rash    HOME MEDICATIONS: See Med List  PAST MEDICAL HISTORY: Past Medical History  Diagnosis Date  . Multiple sclerosis 06/24/2014  . Fatigue 06/24/2014    PAST SURGICAL HISTORY: Past Surgical History  Procedure Laterality Date  . Back surgery      FAMILY HISTORY: Family History  Problem Relation Age of Onset  . Rheum arthritis Father   . Asthma Son   . Cancer Paternal Grandfather   . Heart failure Son   . Hyperlipidemia Father   . Hypertension Father   . Hypertension Mother   . Thyroid disease Neg Hx   . Stroke Neg Hx   . Seizures Neg Hx   . Rashes / Skin problems Neg Hx   . Migraines Neg Hx   . Diabetes Neg Hx     SOCIAL HISTORY:  Social History   Social History  . Marital Status: Single    Spouse Name: N/A  . Number of Children: N/A  . Years of Education: N/A   Occupational History  . Not on file.   Social History Main Topics  . Smoking status: Current Every Day Smoker -- 0.75 packs/day for 35 years    Types: Cigarettes  . Smokeless tobacco: Not on file  . Alcohol Use: Not on file  . Drug Use: No  . Sexual Activity: Not on file   Other Topics Concern  . Not on file   Social History Narrative     PHYSICAL EXAM  Filed Vitals:   01/28/15 1356  BP: 138/80  Pulse: 78  Resp: 14  Height:  (1.727 m)  Weight: 142 lb 12.8 oz (64.774 kg)    Body mass index is 21.72 kg/(m^2).   General: The patient is well-developed and well-nourished and in no acute distress  Neck: The neck is supple, no carotid bruits are noted.  The neck is nontender.  Back:  He is postoperative. Range of motion is normal. He has tenderness in the lower lumbar paraspinal region.  Neurologic Exam  Mental status: The patient is alert and oriented x 3 at the time of the examination. The patient has apparent normal recent and remote memory, with an apparently normal attention span and  concentration ability.   Speech is normal.  Cranial nerves: Extraocular movements are full. Pupils are equal, round, and reactive to light and accomodation.  Visual fields are full.  Facial symmetry is present. There is reduced left facial sensation Facial strength is normal.  Trapezius and sternocleidomastoid strength is normal. No dysarthria is noted.   No obvious hearing deficits are noted.  Motor:  Muscle bulk is normal. Tone is mildly increased in  the legs. Strength is  5 / 5 in all 4 extremities.   Sensory: Sensory testing is intact to touch on all 4 extremities.  Coordination: Cerebellar testing reveals good finger-nose-finger and heel-to-shin bilaterally.  Gait and station: Station is stable.  The gait is mildly wide and the tandem gait is moderately wide. Romberg is negative.   Reflexes: Deep tendon reflexes are symmetric and normal bilaterally.     DIAGNOSTIC DATA (LABS, IMAGING, TESTING) - I reviewed patient records, labs, notes, testing and imaging myself where available.     ASSESSMENT AND PLAN  Multiple sclerosis  Cognitive dysfunction  Other fatigue  Ataxic gait  Numbness  Depression with anxiety    1.   Refill methylphenidate and oxycodone 2.   Continue Plegridy.   He is tolerating it well 3.   Stay active.   Exercise as tolerated 4.  rtc 4 months, sooner if problems.      Gibran Veselka A. Epimenio Foot, MD, PhD 01/28/2015, 2:27 PM Certified in Neurology, Clinical Neurophysiology, Sleep Medicine, Pain Medicine and Neuroimaging  [facility] [address] [place], [place] [address] [phone]

## 2015-01-29 ENCOUNTER — Telehealth: Payer: Self-pay | Admitting: Neurology

## 2015-01-29 NOTE — Telephone Encounter (Signed)
I have mailed Tecfidera start form to Zachary Rasmussen, with instructions to sign in the 2 spots I have marked for him and either mail it back to me or drop it by the office/fim

## 2015-01-29 NOTE — Telephone Encounter (Signed)
I had a chance to compare his MRI performed this week with a 1 from 2015. In the interim, there are 3 new foci , one in the right pons,  One in the deep white matter of the right frontal lobe and one in the periventricular white matter of the right parietal lobe.   I discussed with him that I feel that he should switch from the interferons. We went over some options and he will start Tecfidera.  FAITH:  Please mail him a form to sign and we will complete it when it comes back to [place].

## 2015-02-18 ENCOUNTER — Encounter: Payer: Self-pay | Admitting: *Deleted

## 2015-02-19 ENCOUNTER — Encounter: Payer: Self-pay | Admitting: *Deleted

## 2015-02-25 ENCOUNTER — Telehealth: Payer: Self-pay | Admitting: Neurology

## 2015-02-25 NOTE — Telephone Encounter (Signed)
Zachary Rasmussen with [facility] called requesting prior auth Tecfidera. She can be reached at [phone].

## 2015-02-25 NOTE — Telephone Encounter (Signed)
We do not have [employer] ins on file for the patient.  I called back.  Patient's ID # is [id number].  Ins has been provided with clinical info.  Request is under review.  Ref # [id number]

## 2015-02-26 ENCOUNTER — Encounter: Payer: Self-pay | Admitting: *Deleted

## 2015-02-26 NOTE — Telephone Encounter (Signed)
[employer] has approved the request for coverage on Tecfidera effective until 06/14/2015 Ref # [id number]

## 2015-03-10 ENCOUNTER — Telehealth: Payer: Self-pay | Admitting: Neurology

## 2015-03-10 MED ORDER — METHYLPHENIDATE HCL 10 MG PO TABS: 10.0000 mg | ORAL_TABLET | Freq: Three times a day (TID) | ORAL | Status: DC | PRN

## 2015-03-10 MED ORDER — OXYCODONE-ACETAMINOPHEN 10-325 MG PO TABS: 1.0000 | ORAL_TABLET | Freq: Four times a day (QID) | ORAL | Status: DC | PRN

## 2015-03-10 NOTE — Telephone Encounter (Signed)
Patient is calling and states he has begun taking Rx Tecfidera and wonders how long he should wait to have his white blood count taken.Please call. Also, patient is ordering the following 2 written Rx:  oxydodone-acctaminephen 10-325 mg and methylphenidate 10 mg.  Thanks!

## 2015-03-10 NOTE — Telephone Encounter (Signed)
Rx's printed, on RAS ledge to be signed.  He has an appt. on 05-27-15 and I have advised that RAS will check a CBC at appt.  I will check to make sure he doesn't want one sooner and call Coletin back if he does/fim

## 2015-03-10 NOTE — Telephone Encounter (Signed)
Rx's up front [facility]/fim 

## 2015-03-25 ENCOUNTER — Telehealth: Payer: Self-pay | Admitting: Neurology

## 2015-03-25 NOTE — Telephone Encounter (Signed)
I have spoken with Zachary Rasmussen.  He sts. he is just not tolerating Tecfidera well.  Side effects are itching, flushing, gi upset. Sts. had same side effects at the  dose as well.  He is aware RAS is out of the office until Monday 10-17.  He sts. he will decrease Tecfidera to once daily until Monday to see if this helps.  Appt. given for 04-08-15 in the event that RAS would like to see him to discuss other med options.  I will call Dawayne back after speaking with RAS on Monday.

## 2015-03-25 NOTE — Telephone Encounter (Signed)
Patient called stating he is having reaction to Tecfidera. He states he is having itching, burning, feels cloudy in head, stomach discomfort.He's tried it with foo, without food etc and nothing has helped. He has 5 days left and time to reorder. Does he needs to try something different? Please call and advise at [phone]

## 2015-03-31 NOTE — Telephone Encounter (Signed)
I have spoken with Zachary Rasmussen, and per RAS, advised ok to stop Tecfidera now, and will discuss new med at his appt. on 10-25.  Zachary Rasmussen verbalized understanding of same/fim

## 2015-04-08 ENCOUNTER — Encounter: Payer: Self-pay | Admitting: Neurology

## 2015-04-08 ENCOUNTER — Other Ambulatory Visit: Payer: Self-pay | Admitting: *Deleted

## 2015-04-08 ENCOUNTER — Ambulatory Visit (INDEPENDENT_AMBULATORY_CARE_PROVIDER_SITE_OTHER): Payer: Medicare Other | Admitting: Neurology

## 2015-04-08 VITALS — BP 112/64 | HR 64 | Resp 14 | Ht 68.0 in | Wt 143.4 lb

## 2015-04-08 DIAGNOSIS — R200 Anesthesia of skin: Secondary | ICD-10-CM

## 2015-04-08 DIAGNOSIS — R260 Ataxic gait: Secondary | ICD-10-CM

## 2015-04-08 DIAGNOSIS — G35 Multiple sclerosis: Secondary | ICD-10-CM | POA: Diagnosis not present

## 2015-04-08 DIAGNOSIS — F09 Unspecified mental disorder due to known physiological condition: Secondary | ICD-10-CM | POA: Diagnosis not present

## 2015-04-08 DIAGNOSIS — G35D Multiple sclerosis, unspecified: Secondary | ICD-10-CM

## 2015-04-08 DIAGNOSIS — M545 Low back pain, unspecified: Secondary | ICD-10-CM

## 2015-04-08 DIAGNOSIS — F418 Other specified anxiety disorders: Secondary | ICD-10-CM | POA: Diagnosis not present

## 2015-04-08 DIAGNOSIS — Z79899 Other long term (current) drug therapy: Secondary | ICD-10-CM

## 2015-04-08 DIAGNOSIS — R5383 Other fatigue: Secondary | ICD-10-CM | POA: Diagnosis not present

## 2015-04-08 MED ORDER — OXYCODONE-ACETAMINOPHEN 10-325 MG PO TABS: 1.0000 | ORAL_TABLET | Freq: Four times a day (QID) | ORAL | Status: DC | PRN

## 2015-04-08 MED ORDER — METHYLPHENIDATE HCL 10 MG PO TABS: 10.0000 mg | ORAL_TABLET | Freq: Three times a day (TID) | ORAL | Status: DC | PRN

## 2015-04-08 NOTE — Progress Notes (Signed)
[facility]  PATIENT: Zachary Rasmussen DOB: [birth year]  REFERRING CLINICIAN: No primary care physician HISTORY FROM: Patient REASON FOR VISIT: Multiple sclerosis and worsening symptoms    HISTORICAL  CHIEF COMPLAINT:  Chief Complaint  Patient presents with  . Multiple Sclerosis    Sts. he is unable to tolerate Tecfidera due to flushing, gi side effects.  He stopped it about 2 weeks ago.  Would like to discuss other tx. options/fim    HISTORY OF PRESENT ILLNESS:  He is a 49 yo man with multiple sclerosis.     He recently switched to Tecfidera from Plegridy but was having a lot of flushing and GI side effects so stopped it a couple weeks ago.   He had tolerated the Plegridy well but was having mild progression while on it.     Last MRI a few months ago, showed 3 new lesions including one brainstem focus, prompting the switch to a new medication  Gait/strength/sensation:   He feels balance is mildly worse the past year or two,  He stumbles at times but does not fall.    He feels strength is usually good.   He notes numbness in the left face (old).    The right foot numbness is milder        Vision:    He notes vision has slowly declined over the past few years   Mild diplopia at times.   Has not seen ophthalmologist x 5 or more years.     Fatigue/sleep:    He has difficulty with fatigue, especially in the afternoons and on hot days.  He had done well on once monthly IV steroid for awhile but had less benefit and we had concern of bone loss last year and stopped.       Fatigue is both physical and mental.   He feels he gets benefit from ritalin.   Sleep is poor some night due to difficulty falling asleep when a lot is on his mind about once a week  Bladder:   He has mild urinary frequency.    He denies nocturia or any UTI's.   No bowel issues.   Mood/Cognitoin:   He reports mild depression  Some days but does not feel it affects him and what he does.     He notes some  anxiety .  He notes reduced cognition with  poor attention and  word finding difficulty.    Ritalin has helped the cognitive issues a little bit.  Pain: He has moderate pain in the lower back more than the neck. He feels that oxycodone has significantly helped the pain.    MS History:    He presented initially with optic neuritis on the right. He got much better but continues to have some blurry vision out of the right eye. About the same time, he also began to notice that his gait was a little off balance. Although the vision has pretty much been stable, the gait has worsened over the years. He also has had worsening difficulties with attention and with verbal fluency and with memory.    He also notes numbness in the left face with tingling.    He reports difficulty with fatigue and gets benefit from IV steroid.   However, over the last year, the benefit has been less and there was concern for bone loss.   .   He got more benefit from Ritalin .  He did misuse it (taking higher doses in  the past and then running out early) and it was discontinued a few years ago.   He was initially placed on Betaseron and switched to Plegridy to have fewer number of injections. While on these medicines he has not had any clearcut exacerbations though he has had some progression of his symptoms.   He tried to quit her but stopped it due to tolerability issues.   REVIEW OF SYSTEMS:  Constitutional: No fevers, chills, sweats, or change in appetite.  Has fatigue Eyes: No new visual changes, double vision, eye pain Ear, nose and throat: No hearing loss, ear pain, nasal congestion, sore throat Cardiovascular: No chest pain, palpitations Respiratory:  No shortness of breath at rest or with exertion.   No wheezes GastrointestinaI: No nausea, vomiting, diarrhea, abdominal pain, fecal incontinence Genitourinary:  See above    No nocturia. Musculoskeletal:  No neck pan.   He continues to have back pain even after surgery.     Integumentary: No rash, pruritus, skin lesions Neurological: as above Psychiatric: see above. Endocrine: No palpitations, diaphoresis, change in appetite, change in weigh or increased thirst Hematologic/Lymphatic:  No anemia, purpura, petechiae. Allergic/Immunologic: No itchy/runny eyes, nasal congestion, recent allergic reactions, rashes  ALLERGIES: Allergies  Allergen Reactions  . Penicillins Rash    HOME MEDICATIONS: See Med List  PAST MEDICAL HISTORY: Past Medical History  Diagnosis Date  . Multiple sclerosis (HCC) 06/24/2014  . Fatigue 06/24/2014    PAST SURGICAL HISTORY: Past Surgical History  Procedure Laterality Date  . Back surgery      FAMILY HISTORY: Family History  Problem Relation Age of Onset  . Rheum arthritis Father   . Asthma Son   . Cancer Paternal Grandfather   . Heart failure Son   . Hyperlipidemia Father   . Hypertension Father   . Hypertension Mother   . Thyroid disease Neg Hx   . Stroke Neg Hx   . Seizures Neg Hx   . Rashes / Skin problems Neg Hx   . Migraines Neg Hx   . Diabetes Neg Hx     SOCIAL HISTORY:  Social History   Social History  . Marital Status: Single    Spouse Name: N/A  . Number of Children: N/A  . Years of Education: N/A   Occupational History  . Not on file.   Social History Main Topics  . Smoking status: Current Every Day Smoker -- 0.75 packs/day for 35 years    Types: Cigarettes  . Smokeless tobacco: Not on file  . Alcohol Use: Not on file  . Drug Use: No  . Sexual Activity: Not on file   Other Topics Concern  . Not on file   Social History Narrative     PHYSICAL EXAM  Filed Vitals:   04/08/15 1454  BP: 112/64  Pulse: 64  Resp: 14  Height:  (1.727 m)  Weight: 143 lb 6.4 oz (65.046 kg)    Body mass index is 21.81 kg/(m^2).   General: The patient is well-developed and well-nourished and in no acute distress   Neurologic Exam  Mental status: The patient is alert and oriented x 3 at  the time of the examination. The patient has apparent normal recent and remote memory, with an apparently normal attention span and concentration ability.   Speech is normal.  Cranial nerves: Extraocular movements are full.   Facial symmetry is present. There is reduced left facial sensation Facial strength is normal.  Trapezius and sternocleidomastoid strength is normal. No dysarthria  is noted.   No obvious hearing deficits are noted.  Motor:  Muscle bulk is normal. Tone is mildly increased in the legs. Strength is  5 / 5 in all 4 extremities.   Sensory: Sensory testing is intact to touch on all 4 extremities.  Coordination: Cerebellar testing reveals good finger-nose-finger and heel-to-shin bilaterally.  Gait and station: Station is stable.  The gait is mildly wide and the tandem gait is moderately wide. Romberg is negative.   Reflexes: Deep tendon reflexes are symmetric and normal bilaterally.     DIAGNOSTIC DATA (LABS, IMAGING, TESTING) - I reviewed patient records, labs, notes, testing and imaging myself where available.     ASSESSMENT AND PLAN  Multiple sclerosis (HCC)  Ataxic gait  Cognitive dysfunction  Depression with anxiety  Other fatigue  Numbness  Midline low back pain without sciatica   1.   He has stopped Tecfidera. We discussed options and he is most interested in starting Aubagio. I will check some blood work today and he signed the SRF 2.   Refill methylphenidate and oxycodone 2.   Stay active.   Exercise as tolerated 4.  rtc 4 months, sooner if problems.      Richard A. Epimenio Foot, MD, PhD 04/08/2015, 3:04 PM Certified in Neurology, Clinical Neurophysiology, Sleep Medicine, Pain Medicine and Neuroimaging  [facility] [address] [place], [place] [address] [phone]

## 2015-04-09 LAB — CBC WITH DIFFERENTIAL/PLATELET
BASOS ABS: 0.1 10*3/uL (ref 0.0–0.2)
Basos: 1 %
EOS (ABSOLUTE): 0.3 10*3/uL (ref 0.0–0.4)
Eos: 4 %
Hematocrit: 42.2 % (ref 37.5–51.0)
Hemoglobin: 14 g/dL (ref 12.6–17.7)
IMMATURE GRANULOCYTES: 0 %
Immature Grans (Abs): 0 10*3/uL (ref 0.0–0.1)
LYMPHS: 31 %
Lymphocytes Absolute: 2.5 10*3/uL (ref 0.7–3.1)
MCH: 31.7 pg (ref 26.6–33.0)
MCHC: 33.2 g/dL (ref 31.5–35.7)
MCV: 96 fL (ref 79–97)
MONOS ABS: 0.6 10*3/uL (ref 0.1–0.9)
Monocytes: 7 %
NEUTROS PCT: 57 %
Neutrophils Absolute: 4.6 10*3/uL (ref 1.4–7.0)
PLATELETS: 273 10*3/uL (ref 150–379)
RBC: 4.41 x10E6/uL (ref 4.14–5.80)
RDW: 13.3 % (ref 12.3–15.4)
WBC: 8.1 10*3/uL (ref 3.4–10.8)

## 2015-04-09 LAB — HEPATIC FUNCTION PANEL
ALK PHOS: 70 IU/L (ref 39–117)
ALT: 9 IU/L (ref 0–44)
AST: 14 IU/L (ref 0–40)
Albumin: 4.4 g/dL (ref 3.5–5.5)
Bilirubin Total: 0.5 mg/dL (ref 0.0–1.2)
Bilirubin, Direct: 0.13 mg/dL (ref 0.00–0.40)
Total Protein: 6.8 g/dL (ref 6.0–8.5)

## 2015-04-10 ENCOUNTER — Telehealth: Payer: Self-pay | Admitting: *Deleted

## 2015-04-10 LAB — QUANTIFERON TB GOLD ASSAY (BLOOD)

## 2015-04-10 LAB — QUANTIFERON IN TUBE
QFT TB AG MINUS NIL VALUE: 0.01 IU/mL
QUANTIFERON MITOGEN VALUE: 8.57 [IU]/mL
QUANTIFERON NIL VALUE: 0.03 [IU]/mL
QUANTIFERON TB AG VALUE: 0.04 IU/mL
QUANTIFERON TB GOLD: NEGATIVE

## 2015-04-10 NOTE — Telephone Encounter (Signed)
Start form on RAS ledge to be signed./fim

## 2015-04-10 NOTE — Telephone Encounter (Signed)
-----   Message from Asa Lente, MD sent at 04/09/2015  5:40 PM EDT ----- Labs are fine. We can turn in the Aubagio start form.

## 2015-04-14 ENCOUNTER — Telehealth: Payer: Self-pay | Admitting: *Deleted

## 2015-04-14 NOTE — Telephone Encounter (Signed)
-----   Message from Richard A Sater, MD sent at 04/09/2015  5:40 PM EDT ----- Labs are fine. We can turn in the Aubagio start form. 

## 2015-04-14 NOTE — Telephone Encounter (Signed)
I have spoken with wife this afternoon and per RAS, advised that labs are good, and I have faxed Aubagio start form to [facility] fax # [phone]./fim

## 2015-04-14 NOTE — Telephone Encounter (Signed)
-----   Message from Asa Lente, MD sent at 04/11/2015  2:59 PM EDT ----- Please send in Aubagio form    Labs look good

## 2015-04-14 NOTE — Telephone Encounter (Signed)
duplicate/fim  

## 2015-04-16 NOTE — Telephone Encounter (Signed)
Zachary Rasmussen with [employer] called sts medicare card was not legible. She is requesting to refax start form and write in medicare ID number on the form. If you have any questions she can be reached at [phone]

## 2015-04-17 ENCOUNTER — Encounter: Payer: Self-pay | Admitting: *Deleted

## 2015-04-17 NOTE — Telephone Encounter (Signed)
Medicare # written on start form, also Medicare part D # added to start form, and I have faxed this back to [employer], with fax confirmation received/fim

## 2015-04-21 ENCOUNTER — Encounter: Payer: Self-pay | Admitting: *Deleted

## 2015-04-22 ENCOUNTER — Telehealth: Payer: Self-pay | Admitting: Neurology

## 2015-04-22 NOTE — Telephone Encounter (Signed)
LMTC./fim 

## 2015-04-22 NOTE — Telephone Encounter (Signed)
Pt called and would like to speak the the nurse about his medication. Also wanted to notify nurse that [facility] was going to contact this office. Please call and advise [phone]

## 2015-04-23 NOTE — Telephone Encounter (Signed)
Pt returned call. Please call and advise [phone] Note: the phone number in previous note is wrong. The provided number is correct.

## 2015-04-23 NOTE — Telephone Encounter (Signed)
I have spoken with Pellegrino--he sts. he started Aubagio yesterday.  I advised lft's will be due the 2nd week in December.  I will give him a reminder call when they are due.  He verbalized understanding of same/fim

## 2015-05-22 ENCOUNTER — Other Ambulatory Visit: Payer: Self-pay | Admitting: *Deleted

## 2015-05-22 DIAGNOSIS — G35 Multiple sclerosis: Secondary | ICD-10-CM

## 2015-05-22 DIAGNOSIS — Z79899 Other long term (current) drug therapy: Secondary | ICD-10-CM

## 2015-05-22 MED ORDER — METHYLPHENIDATE HCL 10 MG PO TABS: 10.0000 mg | ORAL_TABLET | Freq: Three times a day (TID) | ORAL | Status: DC | PRN

## 2015-05-22 MED ORDER — OXYCODONE-ACETAMINOPHEN 10-325 MG PO TABS: 1.0000 | ORAL_TABLET | Freq: Four times a day (QID) | ORAL | Status: DC | PRN

## 2015-05-22 NOTE — Telephone Encounter (Signed)
I have spoken with pt. today and reminded that lft's are due.  He verbalized understanding of same, sts. will come in early next week for labs.  Since he drives from a distance, with RAS ok, will provide postdated rx's for Ritalin and Oxycodone.  Rx's printed, signed, up front [facility].  Order for lft's in Epic/fim

## 2015-05-27 ENCOUNTER — Ambulatory Visit: Payer: Self-pay | Admitting: Neurology

## 2015-05-28 ENCOUNTER — Other Ambulatory Visit (INDEPENDENT_AMBULATORY_CARE_PROVIDER_SITE_OTHER): Payer: Self-pay

## 2015-05-28 DIAGNOSIS — Z79899 Other long term (current) drug therapy: Secondary | ICD-10-CM

## 2015-05-28 DIAGNOSIS — G35 Multiple sclerosis: Secondary | ICD-10-CM

## 2015-05-28 DIAGNOSIS — Z0289 Encounter for other administrative examinations: Secondary | ICD-10-CM

## 2015-05-29 ENCOUNTER — Telehealth: Payer: Self-pay | Admitting: *Deleted

## 2015-05-29 LAB — HEPATIC FUNCTION PANEL
ALT: 16 IU/L (ref 0–44)
AST: 14 IU/L (ref 0–40)
Albumin: 4.7 g/dL (ref 3.5–5.5)
Alkaline Phosphatase: 69 IU/L (ref 39–117)
BILIRUBIN, DIRECT: 0.16 mg/dL (ref 0.00–0.40)
Bilirubin Total: 0.6 mg/dL (ref 0.0–1.2)
Total Protein: 7.2 g/dL (ref 6.0–8.5)

## 2015-05-29 NOTE — Telephone Encounter (Signed)
-----   Message from Asa Lente, MD sent at 05/29/2015  8:42 AM EST ----- Please let him know that the liver tests looked good

## 2015-05-29 NOTE — Telephone Encounter (Signed)
LVM for pt to call abouts results. Ok to let pt know liver tests look ok per Dr Epimenio Foot.

## 2015-05-29 NOTE — Telephone Encounter (Signed)
Called pt and advised he should continue his medication. He verbalized understanding.

## 2015-05-29 NOTE — Telephone Encounter (Signed)
Pt is inquiring if he is to continue to take medication

## 2015-07-04 ENCOUNTER — Other Ambulatory Visit: Payer: Self-pay | Admitting: Neurology

## 2015-07-04 MED ORDER — OXYCODONE-ACETAMINOPHEN 10-325 MG PO TABS: 1.0000 | ORAL_TABLET | Freq: Four times a day (QID) | ORAL | Status: DC | PRN

## 2015-07-04 MED ORDER — METHYLPHENIDATE HCL 10 MG PO TABS: 10.0000 mg | ORAL_TABLET | Freq: Three times a day (TID) | ORAL | Status: DC | PRN

## 2015-07-04 NOTE — Telephone Encounter (Signed)
Pt called requesting refill for  methylphenidate (RITALIN) 10 MG tablet and oxyCODONE-acetaminophen (PERCOCET) 10-325 MG tablet .

## 2015-07-07 ENCOUNTER — Encounter: Payer: Self-pay | Admitting: *Deleted

## 2015-07-07 NOTE — Progress Notes (Signed)
Ritalin and Oxycodone rx's up front [facility]/fim 

## 2015-08-05 ENCOUNTER — Telehealth: Payer: Self-pay | Admitting: *Deleted

## 2015-08-05 NOTE — Telephone Encounter (Signed)
I have spoken with Zachary Rasmussen and advised will do labs at his appt. tomorrow.  He does not have to come in early for them.  He verbalized understanding of same/fim

## 2015-08-07 ENCOUNTER — Ambulatory Visit (INDEPENDENT_AMBULATORY_CARE_PROVIDER_SITE_OTHER): Payer: Medicare Other | Admitting: Neurology

## 2015-08-07 ENCOUNTER — Encounter: Payer: Self-pay | Admitting: Neurology

## 2015-08-07 VITALS — BP 158/90 | HR 72 | Resp 16

## 2015-08-07 DIAGNOSIS — G35 Multiple sclerosis: Secondary | ICD-10-CM

## 2015-08-07 DIAGNOSIS — R260 Ataxic gait: Secondary | ICD-10-CM | POA: Diagnosis not present

## 2015-08-07 DIAGNOSIS — R5383 Other fatigue: Secondary | ICD-10-CM

## 2015-08-07 DIAGNOSIS — F418 Other specified anxiety disorders: Secondary | ICD-10-CM

## 2015-08-07 DIAGNOSIS — M545 Low back pain, unspecified: Secondary | ICD-10-CM

## 2015-08-07 DIAGNOSIS — R200 Anesthesia of skin: Secondary | ICD-10-CM | POA: Diagnosis not present

## 2015-08-07 DIAGNOSIS — F09 Unspecified mental disorder due to known physiological condition: Secondary | ICD-10-CM | POA: Diagnosis not present

## 2015-08-07 MED ORDER — METHYLPHENIDATE HCL 10 MG PO TABS: 10.0000 mg | ORAL_TABLET | Freq: Three times a day (TID) | ORAL | Status: DC | PRN

## 2015-08-07 MED ORDER — OXYCODONE-ACETAMINOPHEN 10-325 MG PO TABS: 1.0000 | ORAL_TABLET | Freq: Four times a day (QID) | ORAL | Status: DC | PRN

## 2015-08-07 NOTE — Progress Notes (Signed)
[facility]  PATIENT: Zachary Rasmussen DOB: 1965-11-28  REFERRING CLINICIAN: No primary care physician HISTORY FROM: Patient REASON FOR VISIT: Multiple sclerosis and worsening symptoms    HISTORICAL  CHIEF COMPLAINT:  Chief Complaint  Patient presents with  . Multiple Sclerosis    Sts. he initially had some tinnitus with Aubagio, but it has resolved.  He needs LFT's drawn today/fim    HISTORY OF PRESENT ILLNESS:  He is a 50 yo man with multiple sclerosis.    He switched to Aubagio from [place] (previously on Plegridy and Betaseron).    He had some ear ringing at first but now has no side effects.   He denies any new symptoms or exacerbations.   Last Brain MRI in 2016, showed 3 new lesions including one brainstem focus, prompting the switch away form the interferons.    Gait/strength/sensation:   He feels balance is mildly worse the past year or two,  He stumbles at times but does not fall.    He feels strength is usually good.   He notes numbness in the left face (old).    The right foot numbness is milder        Vision:    He notes vision has slowly declined over the past few years   Mild diplopia at times.   He never did follow up with ophtho as requested.    He sees better with distance than close and gets some benefit from reading glasses.     Fatigue/sleep:    He reports fatigue, worse in the afternoons and on hot days.  This is stable,    He had done well on once monthly IV steroid for a couple years but more recently had less benefit and we had concern of bone loss last year and stopped.       Fatigue is better with ritalin.   Sleep is poor some night due to difficulty falling asleep when a lot is on his mind about once a week  Bladder:   He has mild urinary frequency.    He denies nocturia or any UTI's.   No bowel issues.   Mood/Cognitoin:   He reports mood is better on Aubagio than on other MS drugs.     He notes some anxiety .  He notes reduced cognition  with  poor attention and  word finding difficulty.    Ritalin has helped the cognitive issues a little bit.  Pain: He has moderate pain in the lower back and right hip.   Also notes some pain in the neck. He feels that oxycodone has significantly helped the pain.    MS History:    He presented initially with optic neuritis on the right. He got much better but continues to have some blurry vision out of the right eye. About the same time, he also began to notice that his gait was a little off balance. Although the vision has pretty much been stable, the gait has worsened over the years. He also has had worsening difficulties with attention and with verbal fluency and with memory.    He also notes numbness in the left face with tingling.    He reports difficulty with fatigue and gets benefit from IV steroid.   However, over the last year, the benefit has been less and there was concern for bone loss.   .   He got more benefit from Ritalin .  He did misuse it (taking higher doses in the  past and then running out early) and it was discontinued a few years ago.   He was initially placed on Betaseron and switched to Plegridy to have fewer number of injections. While on these medicines he has not had any clearcut exacerbations though he has had some progression of his symptoms.   He tried to quit her but stopped it due to tolerability issues.   REVIEW OF SYSTEMS:  Constitutional: No fevers, chills, sweats, or change in appetite.  Has fatigue Eyes: No new visual changes, double vision, eye pain Ear, nose and throat: No hearing loss, ear pain, nasal congestion, sore throat Cardiovascular: No chest pain, palpitations Respiratory:  No shortness of breath at rest or with exertion.   No wheezes GastrointestinaI: No nausea, vomiting, diarrhea, abdominal pain, fecal incontinence Genitourinary:  See above    No nocturia. Musculoskeletal:  No neck pan.   He continues to have back pain even after surgery.     Integumentary: No rash, pruritus, skin lesions Neurological: as above Psychiatric: see above. Endocrine: No palpitations, diaphoresis, change in appetite, change in weigh or increased thirst Hematologic/Lymphatic:  No anemia, purpura, petechiae. Allergic/Immunologic: No itchy/runny eyes, nasal congestion, recent allergic reactions, rashes  ALLERGIES: Allergies  Allergen Reactions  . Penicillins Rash    HOME MEDICATIONS: See Med List  PAST MEDICAL HISTORY: Past Medical History  Diagnosis Date  . Multiple sclerosis (HCC) 06/24/2014  . Fatigue 06/24/2014    PAST SURGICAL HISTORY: Past Surgical History  Procedure Laterality Date  . Back surgery      FAMILY HISTORY: Family History  Problem Relation Age of Onset  . Rheum arthritis Father   . Asthma Son   . Cancer Paternal Grandfather   . Heart failure Son   . Hyperlipidemia Father   . Hypertension Father   . Hypertension Mother   . Thyroid disease Neg Hx   . Stroke Neg Hx   . Seizures Neg Hx   . Rashes / Skin problems Neg Hx   . Migraines Neg Hx   . Diabetes Neg Hx     SOCIAL HISTORY:  Social History   Social History  . Marital Status: Single    Spouse Name: N/A  . Number of Children: N/A  . Years of Education: N/A   Occupational History  . Not on file.   Social History Main Topics  . Smoking status: Current Every Day Smoker -- 0.75 packs/day for 35 years    Types: Cigarettes  . Smokeless tobacco: Not on file  . Alcohol Use: Not on file  . Drug Use: No  . Sexual Activity: Not on file   Other Topics Concern  . Not on file   Social History Narrative     PHYSICAL EXAM  Filed Vitals:   08/07/15 1403  BP: 158/90  Pulse: 72  Resp: 16    There is no weight on file to calculate BMI.   General: The patient is well-developed and well-nourished and in no acute distress  Fundoscopic exam shows normal discs and vessels.     Neurologic Exam  Mental status: The patient is alert and oriented x 3  at the time of the examination. The patient has apparent normal recent and remote memory, with an apparently normal attention span and concentration ability.   Speech is normal.  Cranial nerves: Extraocular movements are full.  Pupils show 1+ left APD.   Facial symmetry is present. There is reduced color saturation out of left eye.   left facial sensation Facial  strength is normal.  Trapezius and sternocleidomastoid strength is normal. No dysarthria is noted.   No obvious hearing deficits are noted.  Motor:  Muscle bulk is normal. Tone is mildly increased in the legs. Strength is  5 / 5 in all 4 extremities.   Sensory: Sensory testing is intact to touch on all 4 extremities.  Coordination: Cerebellar testing reveals good finger-nose-finger and heel-to-shin bilaterally.  Gait and station: Station is stable.  The gait is mildly wide and the tandem gait is moderately wide. Romberg is negative.   Reflexes: Deep tendon reflexes are symmetric and normal bilaterally.     DIAGNOSTIC DATA (LABS, IMAGING, TESTING) - I reviewed patient records, labs, notes, testing and imaging myself where available.     ASSESSMENT AND PLAN  Multiple sclerosis (HCC)  Ataxic gait  Depression with anxiety  Other fatigue  Midline low back pain without sciatica  Numbness  Cognitive dysfunction   1.   Continue Aubagio.   Check labs today 2.   Refill methylphenidate and oxycodone 3.   Stay active.   Exercise as tolerated 4.   Advised to see ophthalmology as he notes some visual changes (likely not related to MS) rtc 4 months, sooner if problems.      Richard A. Epimenio Foot, MD, PhD 08/07/2015, 2:13 PM Certified in Neurology, Clinical Neurophysiology, Sleep Medicine, Pain Medicine and Neuroimaging  [facility] [address] [place], [place] [address] [phone]

## 2015-08-07 NOTE — Patient Instructions (Signed)
Please set up an appointment with ophthalmology.

## 2015-08-08 LAB — CBC WITH DIFFERENTIAL/PLATELET
BASOS ABS: 0.1 10*3/uL (ref 0.0–0.2)
Basos: 2 %
EOS (ABSOLUTE): 0.3 10*3/uL (ref 0.0–0.4)
EOS: 4 %
HEMATOCRIT: 40.4 % (ref 37.5–51.0)
Hemoglobin: 13.7 g/dL (ref 12.6–17.7)
IMMATURE GRANULOCYTES: 0 %
Immature Grans (Abs): 0 10*3/uL (ref 0.0–0.1)
LYMPHS ABS: 2.2 10*3/uL (ref 0.7–3.1)
Lymphs: 30 %
MCH: 31.4 pg (ref 26.6–33.0)
MCHC: 33.9 g/dL (ref 31.5–35.7)
MCV: 92 fL (ref 79–97)
MONOS ABS: 0.6 10*3/uL (ref 0.1–0.9)
Monocytes: 9 %
NEUTROS PCT: 55 %
Neutrophils Absolute: 4.1 10*3/uL (ref 1.4–7.0)
PLATELETS: 327 10*3/uL (ref 150–379)
RBC: 4.37 x10E6/uL (ref 4.14–5.80)
RDW: 13.5 % (ref 12.3–15.4)
WBC: 7.3 10*3/uL (ref 3.4–10.8)

## 2015-08-08 LAB — HEPATIC FUNCTION PANEL
ALK PHOS: 76 IU/L (ref 39–117)
ALT: 9 IU/L (ref 0–44)
AST: 10 IU/L (ref 0–40)
Albumin: 4.3 g/dL (ref 3.5–5.5)
Bilirubin Total: 0.6 mg/dL (ref 0.0–1.2)
Bilirubin, Direct: 0.16 mg/dL (ref 0.00–0.40)
TOTAL PROTEIN: 6.5 g/dL (ref 6.0–8.5)

## 2015-08-11 ENCOUNTER — Telehealth: Payer: Self-pay | Admitting: *Deleted

## 2015-08-11 NOTE — Telephone Encounter (Signed)
I have spoken with Zachary Rasmussen this morning and per RAS, advised labs are normal.  Will repeat lft's in a month.  I will give  him a reminder call when they are due.  He verbalized understanding of same/fim

## 2015-08-11 NOTE — Telephone Encounter (Signed)
-----   Message from Asa Lente, MD sent at 08/08/2015  3:55 PM EST ----- Labs looked normal.

## 2015-09-02 ENCOUNTER — Telehealth: Payer: Self-pay | Admitting: Neurology

## 2015-09-02 MED ORDER — OXYCODONE-ACETAMINOPHEN 10-325 MG PO TABS: ORAL_TABLET | ORAL | Status: DC

## 2015-09-02 MED ORDER — METHYLPHENIDATE HCL 10 MG PO TABS: ORAL_TABLET | ORAL | Status: DC

## 2015-09-02 NOTE — Telephone Encounter (Signed)
Ritalin and Oxycodone rx's up front [facility]/fim 

## 2015-09-02 NOTE — Telephone Encounter (Signed)
Rx's awaiting RAS sig/fim 

## 2015-09-02 NOTE — Telephone Encounter (Signed)
Patient called to request refill of oxyCODONE-acetaminophen (PERCOCET) 10-325 MG tablet, methylphenidate (RITALIN) 10 MG tablet. Patient is not sure if he is to do blood work this time, please call to advise if he does.

## 2015-09-06 ENCOUNTER — Other Ambulatory Visit: Payer: Self-pay | Admitting: Neurology

## 2015-09-06 MED ORDER — OXYCODONE-ACETAMINOPHEN 10-325 MG PO TABS: ORAL_TABLET | ORAL | Status: DC

## 2015-09-06 MED ORDER — METHYLPHENIDATE HCL 10 MG PO TABS: ORAL_TABLET | ORAL | Status: DC

## 2015-09-09 NOTE — Telephone Encounter (Signed)
I have spoken with Zachary Rasmussen at [facility] and verified rx. for him/fim

## 2015-09-09 NOTE — Telephone Encounter (Signed)
Zachary Rasmussen with [facility] called said pt brought in written RX on 09/02/15 and it sts fill on or after 3/25 which he did.Then on 09/08/15 he rec'd a fax with date written 09/06/15 that says the same thing. He is confused. Please call at [phone]

## 2015-10-01 ENCOUNTER — Telehealth: Payer: Self-pay | Admitting: Neurology

## 2015-10-01 MED ORDER — METHYLPHENIDATE HCL 10 MG PO TABS: ORAL_TABLET | ORAL | Status: DC

## 2015-10-01 MED ORDER — OXYCODONE-ACETAMINOPHEN 10-325 MG PO TABS: ORAL_TABLET | ORAL | Status: DC

## 2015-10-01 NOTE — Telephone Encounter (Signed)
Ritalin and Oxycodone rx's up front [facility]/fim 

## 2015-10-01 NOTE — Telephone Encounter (Signed)
Rx's awaiting RAS sig/fim 

## 2015-10-01 NOTE — Telephone Encounter (Signed)
Pt is requesting a refill on oxyCODONE-acetaminophen (PERCOCET) 10-325 MG tablet, methylphenidate (RITALIN) 10 MG tablet. Thank you

## 2015-11-03 ENCOUNTER — Telehealth: Payer: Self-pay | Admitting: *Deleted

## 2015-11-03 MED ORDER — METHYLPHENIDATE HCL 10 MG PO TABS: ORAL_TABLET | ORAL | Status: DC

## 2015-11-03 MED ORDER — OXYCODONE-ACETAMINOPHEN 10-325 MG PO TABS: ORAL_TABLET | ORAL | Status: DC

## 2015-11-03 NOTE — Telephone Encounter (Signed)
Rx. awaiting RAS sig/fim 

## 2015-11-03 NOTE — Telephone Encounter (Signed)
Ritalin and Oxycodone rx's up front [facility]/fim 

## 2015-11-03 NOTE — Telephone Encounter (Signed)
Ritalin and Oxycodone rx's up front [facility]/fim

## 2015-11-14 ENCOUNTER — Telehealth: Payer: Self-pay | Admitting: *Deleted

## 2015-11-14 NOTE — Telephone Encounter (Signed)
I have spoken with Echeverri has an appt. with RAS on 12-04-15 at 1520.  RAS will be out of the office during that time, for an MS talk.  Appt. r/s for 12-03-15 at 3pm/fim

## 2015-12-03 ENCOUNTER — Ambulatory Visit (INDEPENDENT_AMBULATORY_CARE_PROVIDER_SITE_OTHER): Payer: Medicare Other | Admitting: Neurology

## 2015-12-03 ENCOUNTER — Encounter: Payer: Self-pay | Admitting: Neurology

## 2015-12-03 VITALS — BP 136/88 | HR 82 | Resp 14 | Ht 68.0 in | Wt 141.0 lb

## 2015-12-03 DIAGNOSIS — R200 Anesthesia of skin: Secondary | ICD-10-CM | POA: Diagnosis not present

## 2015-12-03 DIAGNOSIS — M545 Low back pain, unspecified: Secondary | ICD-10-CM

## 2015-12-03 DIAGNOSIS — R5383 Other fatigue: Secondary | ICD-10-CM | POA: Diagnosis not present

## 2015-12-03 DIAGNOSIS — F09 Unspecified mental disorder due to known physiological condition: Secondary | ICD-10-CM

## 2015-12-03 DIAGNOSIS — R260 Ataxic gait: Secondary | ICD-10-CM | POA: Diagnosis not present

## 2015-12-03 DIAGNOSIS — G35 Multiple sclerosis: Secondary | ICD-10-CM

## 2015-12-03 DIAGNOSIS — F418 Other specified anxiety disorders: Secondary | ICD-10-CM | POA: Diagnosis not present

## 2015-12-03 MED ORDER — OXYCODONE-ACETAMINOPHEN 10-325 MG PO TABS: ORAL_TABLET | ORAL | Status: DC

## 2015-12-03 MED ORDER — METHYLPHENIDATE HCL 10 MG PO TABS: ORAL_TABLET | ORAL | Status: DC

## 2015-12-03 NOTE — Progress Notes (Signed)
[facility]  PATIENT: Zachary Rasmussen DOB: July 20, 1965  REFERRING CLINICIAN: No primary care physician HISTORY FROM: Patient REASON FOR VISIT: Multiple sclerosis and worsening symptoms    HISTORICAL  CHIEF COMPLAINT:  Chief Complaint  Patient presents with  . Multiple Sclerosis    Sts. he continues to tolerate Aubagio well.  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    He switched to Aubagio from [place] around December 2016 (previously on Plegridy and Betaseron).    He reported some ear ringing at first but now has no side effects.   He denies any new symptoms or exacerbations.   Last Brain MRI August 2016, showed 3 new lesions including one brainstem focus, prompting the switch away form the interferons.  He has not had any definite exacerbation on Tecfidera or Aubagio.    Gait/strength/sensation:   He feels balance is mildly worse the past year or two,  He stumbles at times but does not fall.    He feels strength is usually good.   He reports numbness in the left face.    He also has mild right leg/foot numbness (has h/o HNP with lumbar surgery ac ouple years ago).   Occasionally the hands feel numb but never more than a few minutes at a time.    Vision:    His first exacerbation was optic neuritis on the right.  He notes vision has declined over the past few years   He never followed up with ophthalmology.  Mild diplopia at times.   He never did follow up with ophtho as requested.    He sees better with distance than close and gets some benefit from reading glasses.     Fatigue/sleep:    He reports fatigue, worse in the afternoons and on hot days.  This is stable,    He had done well on once monthly IV steroid for a couple years but more recently had less benefit and we had concern of bone loss last year and stopped.       Fatigue is better with ritalin.   Sleep is poor some night due to  difficulty falling asleep when a lot is on his mind about once a week  Bladder:   He has mild urinary frequency.    He denies nocturia or any UTI's.   No bowel issues.   Mood/Cognitoin:   He reports mood is better on Aubagio than on other MS drugs.     He notes some anxiety .  He notes reduced cognition with  poor attention and  word finding difficulty.    Ritalin has helped the cognitive issues a little bit.  Pain: He has moderate pain in the lower back and right hip.   Also notes some pain in the neck. He feels that oxycodone has significantly helped the pain.    MS History:    He presented initially with optic neuritis on the right. He got much better but continues to have some blurry vision out of the right eye. About the same time, he also began to notice that his gait was a little off balance. Although the vision has pretty much been stable, the gait has worsened over the years. He also has had worsening difficulties with attention and with verbal fluency and with memory.    He also notes numbness in the left face with tingling.    He reports  difficulty with fatigue and gets benefit from IV steroid.   However, over the last year, the benefit has been less and there was concern for bone loss.   .   He got more benefit from Ritalin .  He did misuse it (taking higher doses in the past and then running out early) and it was discontinued a few years ago.   He was initially placed on Betaseron and switched to Plegridy to have fewer number of injections. While on these medicines he has not had any clearcut exacerbations though he has had some progression of his symptoms.   He tried to quit her but stopped it due to tolerability issues.   REVIEW OF SYSTEMS:  Constitutional: No fevers, chills, sweats, or change in appetite.  Has fatigue Eyes: No new visual changes, double vision, eye pain Ear, nose and throat: No hearing loss, ear pain, nasal congestion, sore throat Cardiovascular: No chest pain,  palpitations Respiratory:  No shortness of breath at rest or with exertion.   No wheezes GastrointestinaI: No nausea, vomiting, diarrhea, abdominal pain, fecal incontinence Genitourinary:  See above    No nocturia. Musculoskeletal:  No neck pan.   He continues to have back pain even after surgery.   Integumentary: No rash, pruritus, skin lesions Neurological: as above Psychiatric: see above. Endocrine: No palpitations, diaphoresis, change in appetite, change in weigh or increased thirst Hematologic/Lymphatic:  No anemia, purpura, petechiae. Allergic/Immunologic: No itchy/runny eyes, nasal congestion, recent allergic reactions, rashes  ALLERGIES: Allergies  Allergen Reactions  . Penicillins Rash    HOME MEDICATIONS: See Med List  PAST MEDICAL HISTORY: Past Medical History  Diagnosis Date  . Multiple sclerosis (HCC) 06/24/2014  . Fatigue 06/24/2014    PAST SURGICAL HISTORY: Past Surgical History  Procedure Laterality Date  . Back surgery      FAMILY HISTORY: Family History  Problem Relation Age of Onset  . Rheum arthritis Father   . Asthma Son   . Cancer Paternal Grandfather   . Heart failure Son   . Hyperlipidemia Father   . Hypertension Father   . Hypertension Mother   . Thyroid disease Neg Hx   . Stroke Neg Hx   . Seizures Neg Hx   . Rashes / Skin problems Neg Hx   . Migraines Neg Hx   . Diabetes Neg Hx     SOCIAL HISTORY:  Social History   Social History  . Marital Status: Single    Spouse Name: N/A  . Number of Children: N/A  . Years of Education: N/A   Occupational History  . Not on file.   Social History Main Topics  . Smoking status: Current Every Day Smoker -- 0.75 packs/day for 35 years    Types: Cigarettes  . Smokeless tobacco: Not on file  . Alcohol Use: Not on file  . Drug Use: No  . Sexual Activity: Not on file   Other Topics Concern  . Not on file   Social History Narrative     PHYSICAL EXAM  Filed Vitals:   12/03/15 1512    BP: 136/88  Pulse: 82  Resp: 14  Height: 5\' 8"  (1.727 m)  Weight: 141 lb (63.957 kg)    Body mass index is 21.44 kg/(m^2).   General: The patient is well-developed and well-nourished and in no acute distress  Fundoscopic exam shows normal discs and vessels.     Neurologic Exam  Mental status: The patient is alert and oriented x 3 at the time of  the examination. The patient has apparent normal recent and remote memory, with an apparently normal attention span and concentration ability.   Speech is normal.  Cranial nerves: Extraocular movements are full.  Pupils show 1+ left APD.   Facial symmetry is present. There is reduced color saturation out of left eye.   left facial sensation Facial strength is normal.  Trapezius and sternocleidomastoid strength is normal. No dysarthria is noted.   No obvious hearing deficits are noted.  Motor:  Muscle bulk is normal. Tone is mildly increased in the legs. Strength is  5 / 5 in all 4 extremities.   Sensory: Sensory testing is intact to touch on all 4 extremities.  Coordination: Cerebellar testing reveals good finger-nose-finger and heel-to-shin bilaterally.  Gait and station: Station is stable.  The gait is mildly wide and the tandem gait is moderately wide. Romberg is negative.   Reflexes: Deep tendon reflexes are symmetric and normal bilaterally.     DIAGNOSTIC DATA (LABS, IMAGING, TESTING) - I reviewed patient records, labs, notes, testing and imaging myself where available.     ASSESSMENT AND PLAN  Multiple sclerosis (HCC)  Ataxic gait  Depression with anxiety  Other fatigue  Midline low back pain without sciatica  Numbness  Cognitive dysfunction   1.   Continue Aubagio.   Check MRI of the brain around August to assess for subclinical progression. If present, we will need to consider a different medication. 2.   Refill methylphenidate and oxycodone 3.   Stay active.   Exercise as tolerated 4.   Advised again to see  ophthalmology as he notes some visual changes that may not be related to MS rtc 4 months, sooner if problems.      Aiyana Stegmann A. Epimenio Foot, MD, PhD 12/03/2015, 3:22 PM Certified in Neurology, Clinical Neurophysiology, Sleep Medicine, Pain Medicine and Neuroimaging  [facility] [address] [place], [place] [address] [phone]

## 2015-12-04 ENCOUNTER — Ambulatory Visit: Payer: Self-pay | Admitting: Neurology

## 2015-12-08 ENCOUNTER — Telehealth: Payer: Self-pay | Admitting: Neurology

## 2015-12-08 NOTE — Telephone Encounter (Signed)
Zachary Rasmussen/[facility] called, can not get in touch with the pt all numbers that she has have been disconnected. Operator could only verify the numbers she has, could not give out any information. She will send a letter to get Zachary Rasmussen delivered.

## 2015-12-19 IMAGING — CR DG ORBITS FOR FOREIGN BODY
2 series · 2 of 2 positions shown · non-contrast
Comparison: None.

CLINICAL DATA: Metal working/exposure; clearance prior to MRI

EXAM:
ORBITS FOR FOREIGN BODY - 2 VIEW

[w orbit pa (1 of 2)]
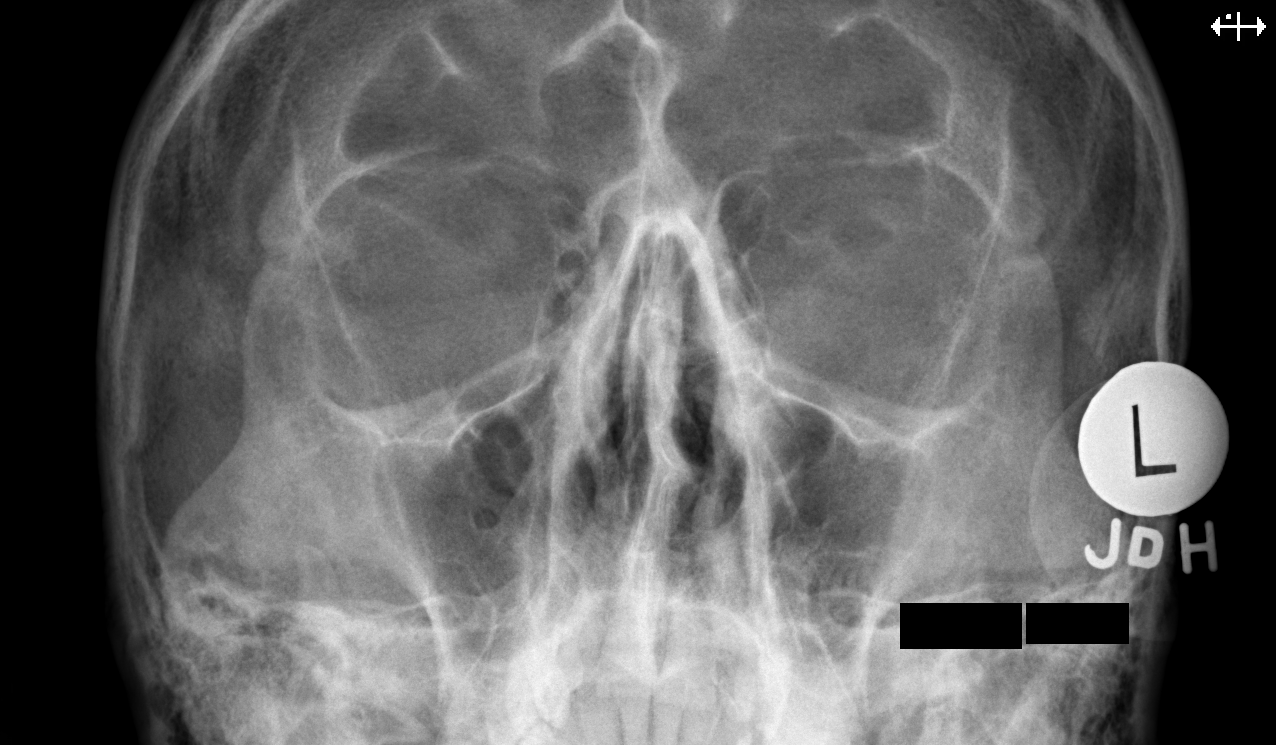

[w orbit pa (2 of 2)]
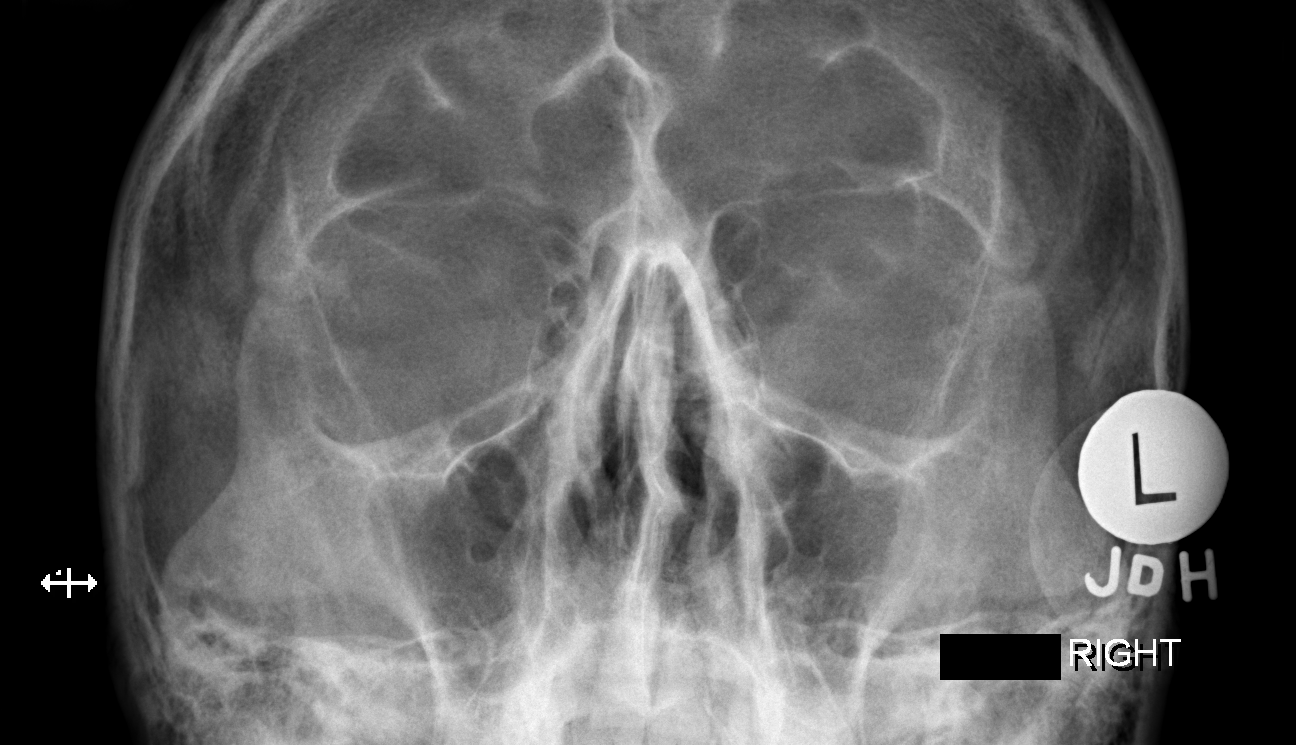

[2 of 2 positions shown; findings below may reference images not displayed]

FINDINGS: There is no evidence of metallic foreign body within the orbits. No
significant bone abnormality identified. The observed portions of
the paranasal sinuses are clear.
IMPRESSION: No evidence of metallic foreign body within the orbits.

## 2016-01-01 ENCOUNTER — Telehealth: Payer: Self-pay | Admitting: Neurology

## 2016-01-01 NOTE — Telephone Encounter (Signed)
I have spoken with Tiffany at [facility] to confirm they are closed on 01-04-16.  She sts. they are closed (it is a Sunday).  Ok to fill Ritalin and Oxycodone rx's on 01-03-16 given.  I have spoken with Kurt and advised ok to have rx's filled, but due date for next rx's will not change--will still be based on 01-04-16 due date this month.  He verbalized understanding of same/fim

## 2016-01-01 NOTE — Telephone Encounter (Signed)
Pt has two rx's oxyCODONE-acetaminophen (PERCOCET) 10-325 MG tablet , methylphenidate (RITALIN) 10 MG tablet stating to fill on or after July 23rd. Pts pharmacy is closed July 23rd and would like to know if someone will call his pharmacy to say it is ok to fill on July 22nd instead. Please call and advise pt .

## 2016-01-19 ENCOUNTER — Other Ambulatory Visit: Payer: Medicare Other

## 2016-01-26 ENCOUNTER — Other Ambulatory Visit: Payer: Medicare Other

## 2016-01-30 ENCOUNTER — Telehealth: Payer: Self-pay | Admitting: Neurology

## 2016-01-30 NOTE — Telephone Encounter (Signed)
Patient requesting refill of oxyCODONE-acetaminophen (PERCOCET) 10-325 MG tablet and methylphenidate (RITALIN) 10 MG tablet. I advised the Rx's will be ready in 24 hours (Monday) unless the nurse advises otherwise. The patient will call before coming to pickup.

## 2016-02-02 MED ORDER — METHYLPHENIDATE HCL 10 MG PO TABS: ORAL_TABLET | ORAL | 0 refills | Status: DC

## 2016-02-02 MED ORDER — OXYCODONE-ACETAMINOPHEN 10-325 MG PO TABS: ORAL_TABLET | ORAL | 0 refills | Status: DC

## 2016-02-02 NOTE — Telephone Encounter (Signed)
Rx's up front [facility].  Pt. aware/fim

## 2016-02-02 NOTE — Telephone Encounter (Signed)
Rx's awaiting RAS sig/fim 

## 2016-02-02 NOTE — Telephone Encounter (Signed)
Patient called to check status of prescription refills

## 2016-02-02 NOTE — Addendum Note (Signed)
Addended by: Candis Schatz I on: 02/02/2016 10:35 AM   Modules accepted: Orders

## 2016-02-23 ENCOUNTER — Other Ambulatory Visit: Payer: Self-pay | Admitting: *Deleted

## 2016-02-23 ENCOUNTER — Telehealth: Payer: Self-pay | Admitting: Neurology

## 2016-02-23 DIAGNOSIS — G894 Chronic pain syndrome: Secondary | ICD-10-CM

## 2016-02-23 MED ORDER — METHYLPHENIDATE HCL 10 MG PO TABS: ORAL_TABLET | ORAL | 0 refills | Status: DC

## 2016-02-23 MED ORDER — OXYCODONE-ACETAMINOPHEN 10-325 MG PO TABS: ORAL_TABLET | ORAL | 0 refills | Status: DC

## 2016-02-23 NOTE — Telephone Encounter (Signed)
Rx's awaiting RAS sig/fim 

## 2016-02-23 NOTE — Telephone Encounter (Signed)
Pt request refill for oxyCODONE-acetaminophen (PERCOCET) 10-325 MG tablet and methylphenidate (RITALIN) 10 MG tablet . Pt said he is having MRI tomorrow @ 3 and would like to pick up RX after that.

## 2016-02-23 NOTE — Telephone Encounter (Signed)
Ritalin and Oxycodone rx's up front [facility].  UDS ordered, per state quidelines/fim

## 2016-02-24 ENCOUNTER — Inpatient Hospital Stay: Admission: RE | Admit: 2016-02-24 | Payer: Medicare Other | Source: Ambulatory Visit

## 2016-02-25 ENCOUNTER — Other Ambulatory Visit (INDEPENDENT_AMBULATORY_CARE_PROVIDER_SITE_OTHER): Payer: Self-pay

## 2016-02-25 DIAGNOSIS — G894 Chronic pain syndrome: Secondary | ICD-10-CM | POA: Diagnosis not present

## 2016-02-25 DIAGNOSIS — Z0289 Encounter for other administrative examinations: Secondary | ICD-10-CM

## 2016-03-02 LAB — OPIATES CONFIRMATION, URINE
Codeine: NEGATIVE
HYDROCODONE CONFIRM: 194 ng/mL
HYDROMORPHONE: POSITIVE — AB
Hydrocodone: POSITIVE — AB
Hydromorphone Confirm: 125 ng/mL
MORPHINE: NEGATIVE
OPIATES: POSITIVE ng/mL — AB

## 2016-03-02 LAB — MONITOR DRUG PROFILE 10(MW)
Amphetamine Scrn, Ur: NEGATIVE ng/mL
BARBITURATE SCREEN URINE: NEGATIVE ng/mL
BENZODIAZEPINE SCREEN, URINE: NEGATIVE ng/mL
CANNABINOIDS UR QL SCN: NEGATIVE ng/mL
COCAINE(METAB.)SCREEN, URINE: NEGATIVE ng/mL
Creatinine(Crt), U: 14.6 mg/dL — ABNORMAL LOW (ref 20.0–300.0)
METHADONE SCREEN, URINE: NEGATIVE ng/mL
PROPOXYPHENE SCREEN URINE: NEGATIVE ng/mL
Ph of Urine: 6.2 (ref 4.5–8.9)
Phencyclidine Qn, Ur: NEGATIVE ng/mL

## 2016-03-02 LAB — OXYCODONE/OXYMORPHONE CONFIRM
OXYCODONE/OXYMORPH: POSITIVE — AB
OXYCODONE: NEGATIVE
OXYMORPHONE CONFIRM: 219 ng/mL
OXYMORPHONE: POSITIVE — AB

## 2016-03-02 LAB — SPECIFIC GRAVITY (REFLEXED): SPECIFIC GRAVITY: 1.0026

## 2016-03-03 ENCOUNTER — Other Ambulatory Visit: Payer: Medicare Other

## 2016-03-05 DIAGNOSIS — F17210 Nicotine dependence, cigarettes, uncomplicated: Secondary | ICD-10-CM | POA: Diagnosis not present

## 2016-03-05 DIAGNOSIS — R05 Cough: Secondary | ICD-10-CM | POA: Diagnosis not present

## 2016-03-05 DIAGNOSIS — R062 Wheezing: Secondary | ICD-10-CM | POA: Diagnosis not present

## 2016-03-10 ENCOUNTER — Other Ambulatory Visit: Payer: Medicare Other

## 2016-03-22 ENCOUNTER — Ambulatory Visit
Admission: RE | Admit: 2016-03-22 | Discharge: 2016-03-22 | Disposition: A | Discharge status: Home / Self Care | Payer: Medicare Other | Source: Ambulatory Visit | Attending: Neurology | Admitting: Neurology

## 2016-03-22 DIAGNOSIS — G35 Multiple sclerosis: Secondary | ICD-10-CM

## 2016-03-22 DIAGNOSIS — R260 Ataxic gait: Secondary | ICD-10-CM

## 2016-03-22 MED ORDER — GADOBENATE DIMEGLUMINE 529 MG/ML IV SOLN
13.0000 mL | Freq: Once | INTRAVENOUS | Status: AC | PRN
  Administered 2016-03-22: 13 mL via INTRAVENOUS

## 2016-03-24 ENCOUNTER — Telehealth: Payer: Self-pay | Admitting: *Deleted

## 2016-03-24 NOTE — Telephone Encounter (Signed)
-----   Message from Asa Lente, MD sent at 03/23/2016  5:14 PM EDT ----- Please let him know that the MRI of the brain showed new lesions not present on his prior MRI including one large focus that was brand-new. The Aubagio does not seem to be working well enough for him and we need to change to a different medicine. I would like him to come in sometime in the next week or so to further discuss this. If he has noted any new symptoms since I last saw him, we can have him get a couple days of IV steroids.

## 2016-03-24 NOTE — Telephone Encounter (Signed)
I have spoken with Zachary Rasmussen this afternoon, and per RAS, advised that MRI brain shows new lesions, indicating that Zachary Rasmussen is not working for him.  He verbalized understanding of same, sts. has been having some new intermittent facial numbness.  Appt. given with RAS for 03-25-16 at 1100, to discuss other tx. options/fim

## 2016-03-25 ENCOUNTER — Encounter: Payer: Self-pay | Admitting: Neurology

## 2016-03-25 ENCOUNTER — Ambulatory Visit (INDEPENDENT_AMBULATORY_CARE_PROVIDER_SITE_OTHER): Payer: Medicare Other | Admitting: Neurology

## 2016-03-25 VITALS — BP 158/72 | HR 80 | Resp 16 | Ht 66.0 in | Wt 138.0 lb

## 2016-03-25 DIAGNOSIS — G35 Multiple sclerosis: Secondary | ICD-10-CM | POA: Diagnosis not present

## 2016-03-25 DIAGNOSIS — Z79899 Other long term (current) drug therapy: Secondary | ICD-10-CM | POA: Diagnosis not present

## 2016-03-25 MED ORDER — OXYCODONE-ACETAMINOPHEN 10-325 MG PO TABS: ORAL_TABLET | ORAL | 0 refills | Status: DC

## 2016-03-25 MED ORDER — METHYLPHENIDATE HCL 10 MG PO TABS: ORAL_TABLET | ORAL | 0 refills | Status: DC

## 2016-03-25 NOTE — Progress Notes (Signed)
[facility]  PATIENT: Zachary Rasmussen DOB: May 01, 1966  REFERRING CLINICIAN: No primary care physician HISTORY FROM: Patient REASON FOR VISIT: Multiple sclerosis and worsening symptoms    HISTORICAL  CHIEF COMPLAINT:  Chief Complaint  Patient presents with  . Rm 12    Recent MRI shows new MS lesions.  He denies missed doses of Aubagio Sts. he has had new sx. of cool sensation right side of his face.  Here today to discuss new tx. options. Sts. he is recovering from pneumonia.  Last kown fever was one week ago/fim  . Follow-up  . Multiple Sclerosis  . Numbness    HISTORY OF PRESENT ILLNESS:  Zachary Rasmussen is a 50 yo man with multiple sclerosis.    He switched to Aubagio from [place] around December 2016 (previously on Plegridy and Betaseron).    He reported some ear ringing at first but now has no side effects.   He denies any new symptoms or exacerbations.   Brain MRI August 2016, showed 3 new lesions including one brainstem focus, prompting the switch away form the interferons.  He has not had any definite exacerbation on Tecfidera But had trouble with tolerability so was switched to [facility] December 2016. He has tolerated Aubagio well. However, MRI performed 2 days ago shows at least 5 or 6 new lesions since his prior MRI including one large enhancing focus nonenhancing focus in the left pons.   Although he has not had any major exacerbation, he has noted a few weeks ago numbness in the right face area and he feels more tired..    I personally reviewed the MRI of the brain performed a couple days ago. The MRI Brain 03/23/16 shows multiple T2/FLAIR hyperintense foci in the pons, and in the periventricular, juxtacortical and deep white matter in a pattern and configuration consistent with multiple sclerosis. There is one large enhancing focus in the left temporoparietal white matter not present on prior MRI.   There are at least 6 other foci in the left pons and in the  periventricular, deep and juxtacortical white matter not present on the prior MRI that do not enhance.There was also chronic sinusitis involving the sphenoid sinus and some ethmoid air cells.    Gait/strength/sensation:   He feels balance is mildly worse the past year or two,  He stumbles at times but does not fall.    He feels strength is usually good.   He reports numbness in the left face.    He also has mild right leg/foot numbness (has h/o HNP with lumbar surgery ac ouple years ago).   Occasionally the hands feel numb but never more than a few minutes at a time.    Vision:     He notes vision has declined over the past few years    He sees better with distance than close and gets some benefit from reading glasses.     Fatigue/sleep:    He reports fatigue, worse in the afternoons and on hot days.     Fatigue is better with ritalin.   Sleep is poor some night due to difficulty falling asleep when a lot is on his mind about once a week  Bladder:   He has mild urinary frequency.    He denies nocturia or any UTI's.   No bowel issues.   Mood/Cognitoin:   He reports mood is better on Aubagio than on other MS drugs.     He notes some anxiety .  He  notes reduced cognition with  poor attention and  word finding difficulty.    Ritalin has helped the cognitive issues a little bit.  Pain: He has moderate pain in the lower back and right hip.   Also notes some pain in the neck. He feels that oxycodone has significantly helped the pain.    MS History:    He presented initially with optic neuritis on the right. He got much better but continues to have some blurry vision out of the right eye. About the same time, he also began to notice that his gait was a little off balance. Although the vision has pretty much been stable, the gait has worsened over the years. He also has had worsening difficulties with attention and with verbal fluency and with memory.    He also notes numbness in the left face with tingling.     He reports difficulty with fatigue and gets benefit from IV steroid.   However, over the last year, the benefit has been less and there was concern for bone loss.   .   He got more benefit from Ritalin .  He did misuse it (taking higher doses in the past and then running out early) and it was discontinued a few years ago.   He was initially placed on Betaseron and switched to Plegridy to have fewer number of injections. While on these medicines he has not had any clearcut exacerbations though he has had some progression of his symptoms.   He tried to quit her but stopped it due to tolerability issues.   REVIEW OF SYSTEMS:  Constitutional: No fevers, chills, sweats, or change in appetite.  Has fatigue Eyes: No new visual changes, double vision, eye pain Ear, nose and throat: No hearing loss, ear pain, nasal congestion, sore throat Cardiovascular: No chest pain, palpitations Respiratory:  No shortness of breath at rest or with exertion.   No wheezes GastrointestinaI: No nausea, vomiting, diarrhea, abdominal pain, fecal incontinence Genitourinary:  See above    No nocturia. Musculoskeletal:  No neck pan.   He continues to have back pain even after surgery.   Integumentary: No rash, pruritus, skin lesions Neurological: as above Psychiatric: see above. Endocrine: No palpitations, diaphoresis, change in appetite, change in weigh or increased thirst Hematologic/Lymphatic:  No anemia, purpura, petechiae. Allergic/Immunologic: No itchy/runny eyes, nasal congestion, recent allergic reactions, rashes  ALLERGIES: Allergies  Allergen Reactions  . Penicillins Rash    HOME MEDICATIONS: See Med List  PAST MEDICAL HISTORY: Past Medical History:  Diagnosis Date  . Fatigue 06/24/2014  . Multiple sclerosis (HCC) 06/24/2014    PAST SURGICAL HISTORY: Past Surgical History:  Procedure Laterality Date  . BACK SURGERY      FAMILY HISTORY: Family History  Problem Relation Age of Onset  . Rheum  arthritis Father   . Asthma Son   . Cancer Paternal Grandfather   . Heart failure Son   . Hyperlipidemia Father   . Hypertension Father   . Hypertension Mother   . Thyroid disease Neg Hx   . Stroke Neg Hx   . Seizures Neg Hx   . Rashes / Skin problems Neg Hx   . Migraines Neg Hx   . Diabetes Neg Hx     SOCIAL HISTORY:  Social History   Social History  . Marital status: Single    Spouse name: N/A  . Number of children: N/A  . Years of education: N/A   Occupational History  . Not on file.  Social History Main Topics  . Smoking status: Current Every Day Smoker    Packs/day: 0.75    Years: 35.00    Types: Cigarettes  . Smokeless tobacco: Not on file  . Alcohol use Not on file  . Drug use: No  . Sexual activity: Not on file   Other Topics Concern  . Not on file   Social History Narrative  . No narrative on file     PHYSICAL EXAM  Vitals:   03/25/16 1136  BP: (!) 158/72  Pulse: 80  Resp: 16  Weight: 138 lb (62.6 kg)  Height: 5\' 6"  (1.676 m)    Body mass index is 22.27 kg/m.   General: The patient is well-developed and well-nourished and in no acute distress  Fundoscopic exam shows normal discs and vessels.     Neurologic Exam  Mental status: The patient is alert and oriented x 3 at the time of the examination. The patient has apparent normal recent and remote memory, with an apparently normal attention span and concentration ability.   Speech is normal.  Cranial nerves: Extraocular movements are full.  Pupils show 1+ left APD.   Facial symmetry is present. There is reduced color saturation out of left eye.   left facial sensation Facial strength is normal.  Trapezius and sternocleidomastoid strength is normal. No dysarthria is noted.   No obvious hearing deficits are noted.  Motor:  Muscle bulk is normal. Tone is mildly increased in the legs. Strength is  5 / 5 in all 4 extremities.   Sensory: Sensory testing is intact to touch on all 4  extremities.  Coordination: Cerebellar testing reveals good finger-nose-finger and heel-to-shin bilaterally.  Gait and station: Station is stable.  The gait is mildly wide and the tandem gait is moderately wide. Romberg is negative.   Reflexes: Deep tendon reflexes are symmetric and normal bilaterally.     DIAGNOSTIC DATA (LABS, IMAGING, TESTING) - I reviewed patient records, labs, notes, testing and imaging myself where available.     ASSESSMENT AND PLAN  No diagnosis found.  1.   He has relapsing remitting multiple sclerosis and had a recent exacerbation. 1 g IV Solu-Medrol today. We discussed the results of the recent MRI. He does not appear to be doing well on Aubagio and we need to change to a different disease modifying therapy. We went over the pros and cons of ocrelizumab and he is interested in switching. Blood work was drawn today.  2.   Refill methylphenidate and oxycodone 3.   Stay active.   Exercise as tolerated 4.   rtc 4 months, sooner if problems.      Khristian Seals A. Epimenio FootSater, MD, PhD 03/25/2016, 11:42 AM Certified in Neurology, Clinical Neurophysiology, Sleep Medicine, Pain Medicine and Neuroimaging  [facility] [address], [address] [place], [place] [id number] [phone]

## 2016-03-26 ENCOUNTER — Encounter: Payer: Self-pay | Admitting: *Deleted

## 2016-03-26 LAB — CBC WITH DIFFERENTIAL/PLATELET
BASOS ABS: 0.1 10*3/uL (ref 0.0–0.2)
BASOS: 2 %
EOS (ABSOLUTE): 0.3 10*3/uL (ref 0.0–0.4)
Eos: 6 %
Hematocrit: 38.2 % (ref 37.5–51.0)
Hemoglobin: 13 g/dL (ref 12.6–17.7)
IMMATURE GRANS (ABS): 0 10*3/uL (ref 0.0–0.1)
IMMATURE GRANULOCYTES: 0 %
Lymphocytes Absolute: 1.5 10*3/uL (ref 0.7–3.1)
Lymphs: 29 %
MCH: 31.9 pg (ref 26.6–33.0)
MCHC: 34 g/dL (ref 31.5–35.7)
MCV: 94 fL (ref 79–97)
MONOS ABS: 0.5 10*3/uL (ref 0.1–0.9)
Monocytes: 9 %
Neutrophils Absolute: 2.8 10*3/uL (ref 1.4–7.0)
Neutrophils: 54 %
PLATELETS: 279 10*3/uL (ref 150–379)
RBC: 4.08 x10E6/uL — ABNORMAL LOW (ref 4.14–5.80)
RDW: 13.2 % (ref 12.3–15.4)
WBC: 5.2 10*3/uL (ref 3.4–10.8)

## 2016-03-26 LAB — HEPATIC FUNCTION PANEL
ALK PHOS: 64 IU/L (ref 39–117)
ALT: 13 IU/L (ref 0–44)
AST: 17 IU/L (ref 0–40)
Albumin: 4.3 g/dL (ref 3.5–5.5)
Bilirubin Total: 0.7 mg/dL (ref 0.0–1.2)
Bilirubin, Direct: 0.19 mg/dL (ref 0.00–0.40)
TOTAL PROTEIN: 6.8 g/dL (ref 6.0–8.5)

## 2016-03-26 LAB — HEPATITIS B SURFACE ANTIGEN: Hepatitis B Surface Ag: NEGATIVE

## 2016-03-26 LAB — HEPATITIS B CORE ANTIBODY, TOTAL: HEP B C TOTAL AB: NEGATIVE

## 2016-03-26 LAB — HEPATITIS B SURFACE ANTIBODY,QUALITATIVE: HEP B SURFACE AB, QUAL: NONREACTIVE

## 2016-03-30 LAB — QUANTIFERON IN TUBE
QFT TB AG MINUS NIL VALUE: 0 IU/mL
QUANTIFERON NIL VALUE: 0.04 [IU]/mL
QUANTIFERON TB AG VALUE: 0.04 [IU]/mL
QUANTIFERON TB GOLD: NEGATIVE

## 2016-03-30 LAB — QUANTIFERON TB GOLD ASSAY (BLOOD)

## 2016-03-31 ENCOUNTER — Encounter: Payer: Self-pay | Admitting: *Deleted

## 2016-03-31 ENCOUNTER — Telehealth: Payer: Self-pay | Admitting: *Deleted

## 2016-03-31 NOTE — Telephone Encounter (Signed)
I have spoken with Zachary Rasmussen this morning and advised that labwork done in our office was ok.  [facility]cre[facility]/fim

## 2016-03-31 NOTE — Telephone Encounter (Signed)
-----   Message from Asa Lente, MD sent at 03/30/2016  5:34 PM EDT ----- Labs are fine. If we have not already done so, please send in the ocrelizumab form

## 2016-04-12 ENCOUNTER — Ambulatory Visit: Payer: Self-pay | Admitting: Neurology

## 2016-04-16 ENCOUNTER — Other Ambulatory Visit: Payer: Self-pay | Admitting: Neurology

## 2016-04-22 ENCOUNTER — Other Ambulatory Visit: Payer: Self-pay | Admitting: Neurology

## 2016-04-22 MED ORDER — CHOLESTYRAMINE 4 G PO PACK: PACK | ORAL | 0 refills | Status: DC

## 2016-04-23 ENCOUNTER — Telehealth: Payer: Self-pay | Admitting: *Deleted

## 2016-04-23 ENCOUNTER — Telehealth: Payer: Self-pay | Admitting: Neurology

## 2016-04-23 NOTE — Telephone Encounter (Signed)
encounter opened in error

## 2016-04-23 NOTE — Telephone Encounter (Signed)
[facility] [phone] called to ask if Rx for cholestyramine (QUESTRAN) 4 g packet can be switched to cholestyramine in a can, states they normally don't break up a box (individual packets/60), Rx is for 33. Please call to advise.

## 2016-04-28 ENCOUNTER — Telehealth: Payer: Self-pay | Admitting: *Deleted

## 2016-04-28 NOTE — Telephone Encounter (Signed)
Error/fim 

## 2016-05-03 ENCOUNTER — Telehealth: Payer: Self-pay | Admitting: Neurology

## 2016-05-03 MED ORDER — METHYLPHENIDATE HCL 10 MG PO TABS: ORAL_TABLET | ORAL | 0 refills | Status: DC

## 2016-05-03 MED ORDER — OXYCODONE-ACETAMINOPHEN 10-325 MG PO TABS: ORAL_TABLET | ORAL | 0 refills | Status: DC

## 2016-05-03 NOTE — Telephone Encounter (Signed)
Patient requesting refill of methylphenidate (RITALIN) 10 MG tablet , oxyCODONE-acetaminophen (PERCOCET) 10-325 MG tablet Pharmacy: pick up

## 2016-05-03 NOTE — Telephone Encounter (Signed)
Rx's awaiting RAS sig/fim 

## 2016-05-04 NOTE — Telephone Encounter (Signed)
Rx's up front [facility].  Pt. aware/fim 

## 2016-05-04 NOTE — Telephone Encounter (Signed)
Pt called wanting to know if RX was ready. I advised him Dr Epimenio Foot was out of the office yesterday, he is seeing pt's now. I advised him to call back this afternoon to check for written RX's.

## 2016-05-25 ENCOUNTER — Telehealth: Payer: Self-pay | Admitting: *Deleted

## 2016-05-25 ENCOUNTER — Telehealth: Payer: Self-pay | Admitting: Neurology

## 2016-05-25 NOTE — Telephone Encounter (Signed)
Pt called said he missed his infusion appt. He needs to r.s. Pt said he confirmed the appt yesterday but missed it. He is requesting a note sent to RN and he was transferred to [facility] ext.

## 2016-05-25 NOTE — Telephone Encounter (Signed)
Noted/fim 

## 2016-05-25 NOTE — Telephone Encounter (Addendum)
Pt states he has appointment today but I don't see one scheduled, he said that he called and confirmed yesterday  he  Can be reached at [phone].  He was calling to reschedule appointment / Rushie GoltzFaith, never mind, I realize now [facility] is for [facility].  I'll let her know

## 2016-05-31 ENCOUNTER — Telehealth: Payer: Self-pay | Admitting: Neurology

## 2016-05-31 MED ORDER — OXYCODONE-ACETAMINOPHEN 10-325 MG PO TABS: ORAL_TABLET | ORAL | 0 refills | Status: DC

## 2016-05-31 MED ORDER — METHYLPHENIDATE HCL 10 MG PO TABS: ORAL_TABLET | ORAL | 0 refills | Status: DC

## 2016-05-31 NOTE — Addendum Note (Signed)
Addended by: Candis Schatz I on: 05/31/2016 03:18 PM   Modules accepted: Orders

## 2016-05-31 NOTE — Telephone Encounter (Signed)
Rx's awaiting RAS sig/fim 

## 2016-05-31 NOTE — Telephone Encounter (Signed)
Patient requesting refill of methylphenidate (RITALIN) 10 MG tablet and oxyCODONE-acetaminophen (PERCOCET) 10-325 MG tablet.

## 2016-06-01 NOTE — Telephone Encounter (Signed)
Ritalin and Oxycodone rx's up front [facility]/fim 

## 2016-06-02 DIAGNOSIS — G35 Multiple sclerosis: Secondary | ICD-10-CM | POA: Diagnosis not present

## 2016-06-23 ENCOUNTER — Ambulatory Visit (INDEPENDENT_AMBULATORY_CARE_PROVIDER_SITE_OTHER): Payer: Medicare Other | Admitting: Neurology

## 2016-06-23 ENCOUNTER — Encounter: Payer: Self-pay | Admitting: Neurology

## 2016-06-23 VITALS — BP 166/90 | HR 82 | Resp 16 | Ht 66.0 in | Wt 154.5 lb

## 2016-06-23 DIAGNOSIS — M545 Low back pain, unspecified: Secondary | ICD-10-CM

## 2016-06-23 DIAGNOSIS — R260 Ataxic gait: Secondary | ICD-10-CM | POA: Diagnosis not present

## 2016-06-23 DIAGNOSIS — F09 Unspecified mental disorder due to known physiological condition: Secondary | ICD-10-CM

## 2016-06-23 DIAGNOSIS — G35 Multiple sclerosis: Secondary | ICD-10-CM

## 2016-06-23 DIAGNOSIS — R5383 Other fatigue: Secondary | ICD-10-CM

## 2016-06-23 MED ORDER — OXYCODONE-ACETAMINOPHEN 10-325 MG PO TABS: ORAL_TABLET | ORAL | 0 refills | Status: DC

## 2016-06-23 MED ORDER — METHYLPHENIDATE HCL 10 MG PO TABS: ORAL_TABLET | ORAL | 0 refills | Status: DC

## 2016-06-23 NOTE — Progress Notes (Signed)
[facility]  PATIENT: Zachary Rasmussen DOB: Aug 27, 1965  REFERRING CLINICIAN: No primary care physician HISTORY FROM: Patient REASON FOR VISIT: Multiple sclerosis and worsening symptoms    HISTORICAL  CHIEF COMPLAINT:  Chief Complaint  Patient presents with  . Multiple Sclerosis    He has had part A of first Ocrelizumab infusion--is scheduled for part B tomorrow.  Sts. is tolerating well so far./fim    HISTORY OF PRESENT ILLNESS:  Zachary Rasmussen is a 51 yo man with multiple sclerosis.      MS:    MRI of the brain that he had performed 03/23/2016 showed at least 6 foci in the hemispheres in the pons not present on his 2016 MRI including one enhancing focus in the left temporoparietal lobe.    Because of that finding, he switched from Aubagio to ocrelizumab and he had his first infusion (first half of first dose)  a couple weeks ago and returns later this week for his second infusion.   He has tolerated well.  He had previously switched from Tecfidera to [place] after the 2016 MRI showed several new lesions.   Gait/strength/sensation:   He notes balance worsened last year but is no worse today than a couple months ago.   He has no falls.    He feels strength is good.   He reports mild numbness in the left face (better).    He also has mild right leg/foot numbness (but has   lumbar surgery a couple years ago).   Occasionally the hands feel numb but never more than a few minutes at a time.    Vision:     He notes vision has declined over the past few years .   He is going to see a new ophthalmologist soon.     Reading glasses help but incompletely.    Fatigue/sleep:    He reports fatigue, worse in the afternoons.     Fatigue is better with ritalin.   Sleep is poor some night due to difficulty falling asleep about once a week  Bladder:   He has mild urinary frequency.    He denies nocturia or any UTI's.   .   Mood/Cognitoin:   He reports mood is better on Aubagio than on  other MS drugs.     He notes some anxiety .  He notes reduced cognition with  poor attention and  word finding difficulty.    Ritalin has helped the cognitive issues a little bit.  Pain: He has moderate pain in the lower back and right hip.   Also notes some pain in the neck. He feels that oxycodone has significantly helped the pain.    MS History:    He presented initially with optic neuritis on the right. He got much better but continues to have some blurry vision out of the right eye. About the same time, he also began to notice that his gait was a little off balance. Although the vision has pretty much been stable, the gait has worsened over the years. He also has had worsening difficulties with attention and with verbal fluency and with memory.    He also notes numbness in the left face with tingling.    He reports difficulty with fatigue and gets benefit from IV steroid.   However, over the last year, the benefit has been less and there was concern for bone loss.   .   He got more benefit from Ritalin .  He did  misuse it (taking higher doses in the past and then running out early) and it was discontinued a few years ago.   He was initially placed on Betaseron and switched to Plegridy to have fewer number of injections. While on these medicines he has not had any clearcut exacerbations though he has had some progression of his symptoms.   He tried to quit her but stopped it due to tolerability issues.   REVIEW OF SYSTEMS:  Constitutional: No fevers, chills, sweats, or change in appetite.  Has fatigue Eyes: No new visual changes, double vision, eye pain Ear, nose and throat: No hearing loss, ear pain, nasal congestion, sore throat Cardiovascular: No chest pain, palpitations Respiratory:  No shortness of breath at rest or with exertion.   No wheezes GastrointestinaI: No nausea, vomiting, diarrhea, abdominal pain, fecal incontinence Genitourinary:  See above    No nocturia. Musculoskeletal:  No neck  pan.   He continues to have back pain even after surgery.   Integumentary: No rash, pruritus, skin lesions Neurological: as above Psychiatric: see above. Endocrine: No palpitations, diaphoresis, change in appetite, change in weigh or increased thirst Hematologic/Lymphatic:  No anemia, purpura, petechiae. Allergic/Immunologic: No itchy/runny eyes, nasal congestion, recent allergic reactions, rashes  ALLERGIES: Allergies  Allergen Reactions  . Penicillins Rash    HOME MEDICATIONS: See Med List  PAST MEDICAL HISTORY: Past Medical History:  Diagnosis Date  . Fatigue 06/24/2014  . Multiple sclerosis (HCC) 06/24/2014    PAST SURGICAL HISTORY: Past Surgical History:  Procedure Laterality Date  . BACK SURGERY      FAMILY HISTORY: Family History  Problem Relation Age of Onset  . Rheum arthritis Father   . Asthma Son   . Cancer Paternal Grandfather   . Heart failure Son   . Hyperlipidemia Father   . Hypertension Father   . Hypertension Mother   . Thyroid disease Neg Hx   . Stroke Neg Hx   . Seizures Neg Hx   . Rashes / Skin problems Neg Hx   . Migraines Neg Hx   . Diabetes Neg Hx     SOCIAL HISTORY:  Social History   Social History  . Marital status: Single    Spouse name: N/A  . Number of children: N/A  . Years of education: N/A   Occupational History  . Not on file.   Social History Main Topics  . Smoking status: Current Every Day Smoker    Packs/day: 0.75    Years: 35.00    Types: Cigarettes  . Smokeless tobacco: Not on file  . Alcohol use Not on file  . Drug use: No  . Sexual activity: Not on file   Other Topics Concern  . Not on file   Social History Narrative  . No narrative on file     PHYSICAL EXAM  Vitals:   06/23/16 1556  BP: (!) 166/90  Pulse: 82  Resp: 16  Weight: 154 lb 8 oz (70.1 kg)  Height: 5\' 6"  (1.676 m)    Body mass index is 24.94 kg/m.   General: The patient is well-developed and well-nourished and in no acute  distress  Fundoscopic exam shows normal discs and vessels.     Neurologic Exam  Mental status: The patient is alert and oriented x 3 at the time of the examination. The patient has apparent normal recent and remote memory, with an apparently normal attention span and concentration ability.   Speech is normal.  Cranial nerves: Extraocular movements are full.  Pupils show 1+ left APD.   Facial symmetry is present. There is reduced color saturation out of left eye.   left facial sensation Facial strength is normal.  Trapezius and sternocleidomastoid strength is normal. No dysarthria is noted.   No obvious hearing deficits are noted.  Motor:  Muscle bulk is normal. Tone is mildly increased in the legs. Strength is  5 / 5 in all 4 extremities.   Sensory: Sensory testing is intact to touch on all 4 extremities.  Coordination: Cerebellar testing reveals good finger-nose-finger and slightl reduced heel-to-shin bilaterally.  Gait and station: Station is stable.  The gait is mildly wide and the tandem gait is moderately wide (improved from last visit). Romberg is negative.   Reflexes: Deep tendon reflexes are symmetric and increasd in legs with spread at the knees.   No ankle clonus.     DIAGNOSTIC DATA (LABS, IMAGING, TESTING) - I reviewed patient records, labs, notes, testing and imaging myself where available.     ASSESSMENT AND PLAN  Multiple sclerosis (HCC)  Ataxic gait  Other fatigue  Cognitive dysfunction  Midline low back pain without sciatica, unspecified chronicity  1.   Continue ocrelizumab for MS..  2.   Refill methylphenidate and oxycodone 3.   Stay active.   Exercise as tolerated 4.   rtc 5 months, sooner if problems.      Daviona Herbert A. Epimenio Foot, MD, PhD 06/23/2016, 5:27 PM Certified in Neurology, Clinical Neurophysiology, Sleep Medicine, Pain Medicine and Neuroimaging  [facility] [address] [place], [place] [address] [phone]

## 2016-06-24 DIAGNOSIS — G35 Multiple sclerosis: Secondary | ICD-10-CM | POA: Diagnosis not present

## 2016-06-28 DIAGNOSIS — G35 Multiple sclerosis: Secondary | ICD-10-CM | POA: Diagnosis not present

## 2016-07-26 ENCOUNTER — Telehealth: Payer: Self-pay | Admitting: Neurology

## 2016-07-26 DIAGNOSIS — R05 Cough: Secondary | ICD-10-CM | POA: Diagnosis not present

## 2016-07-26 DIAGNOSIS — J189 Pneumonia, unspecified organism: Secondary | ICD-10-CM | POA: Diagnosis not present

## 2016-07-26 DIAGNOSIS — Z1389 Encounter for screening for other disorder: Secondary | ICD-10-CM | POA: Diagnosis not present

## 2016-07-26 DIAGNOSIS — R0602 Shortness of breath: Secondary | ICD-10-CM | POA: Diagnosis not present

## 2016-07-26 DIAGNOSIS — Z6821 Body mass index (BMI) 21.0-21.9, adult: Secondary | ICD-10-CM | POA: Diagnosis not present

## 2016-07-26 DIAGNOSIS — J449 Chronic obstructive pulmonary disease, unspecified: Secondary | ICD-10-CM | POA: Diagnosis not present

## 2016-07-26 DIAGNOSIS — R918 Other nonspecific abnormal finding of lung field: Secondary | ICD-10-CM | POA: Diagnosis not present

## 2016-07-26 NOTE — Telephone Encounter (Signed)
Patient needs a call back to discuss Ocrevus. He is having symptoms of the flu and not sure if this is coming from this medication.

## 2016-07-26 NOTE — Telephone Encounter (Signed)
I have spoken with Zachary Rasmussen this morning.  He sts. he has been running a fever for several days, has much chest congestion, feels short of breath.  I have advised he see his pcp/urgent care today to r/o flu or other bacterial/viral illness that needs to be treated.  RAS aware and agreeable/fim

## 2016-07-29 ENCOUNTER — Telehealth: Payer: Self-pay | Admitting: Neurology

## 2016-07-29 MED ORDER — OXYCODONE-ACETAMINOPHEN 10-325 MG PO TABS: ORAL_TABLET | ORAL | 0 refills | Status: DC

## 2016-07-29 MED ORDER — METHYLPHENIDATE HCL 10 MG PO TABS: ORAL_TABLET | ORAL | 0 refills | Status: DC

## 2016-07-29 NOTE — Addendum Note (Signed)
Addended by: Candis Schatz I on: 07/29/2016 11:27 AM   Modules accepted: Orders

## 2016-07-29 NOTE — Telephone Encounter (Signed)
Pt request refill for oxyCODONE-acetaminophen (PERCOCET) 10-325 MG tablet and methylphenidate (RITALIN) 10 MG tablet . Pt is aware the clinic closes at noon tomorrow

## 2016-07-29 NOTE — Telephone Encounter (Signed)
Rx's awaiting RAS sig/fim 

## 2016-07-29 NOTE — Telephone Encounter (Signed)
Rx's up front [facility]/fim 

## 2016-08-25 ENCOUNTER — Telehealth: Payer: Self-pay | Admitting: Neurology

## 2016-08-25 MED ORDER — OXYCODONE-ACETAMINOPHEN 10-325 MG PO TABS: ORAL_TABLET | ORAL | 0 refills | Status: DC

## 2016-08-25 MED ORDER — METHYLPHENIDATE HCL 10 MG PO TABS: ORAL_TABLET | ORAL | 0 refills | Status: DC

## 2016-08-25 NOTE — Telephone Encounter (Signed)
Rx's awaiting RAS sig/fim 

## 2016-08-25 NOTE — Telephone Encounter (Signed)
Patient requesting refill of methylphenidate (RITALIN) 10 MG tablet and oxyCODONE-acetaminophen (PERCOCET) 10-325 MG tablet. The patient said he would like to get filled on 08-29-15 because [facility] is closed on Sunday 08-29-16.

## 2016-08-25 NOTE — Telephone Encounter (Signed)
Rx. up front [facility]/fim 

## 2016-08-25 NOTE — Addendum Note (Signed)
Addended by: Candis Schatz I on: 08/25/2016 04:19 PM   Modules accepted: Orders

## 2016-09-22 DIAGNOSIS — Z6820 Body mass index (BMI) 20.0-20.9, adult: Secondary | ICD-10-CM | POA: Diagnosis not present

## 2016-09-22 DIAGNOSIS — Z79899 Other long term (current) drug therapy: Secondary | ICD-10-CM | POA: Diagnosis not present

## 2016-09-22 DIAGNOSIS — I10 Essential (primary) hypertension: Secondary | ICD-10-CM | POA: Diagnosis not present

## 2016-09-29 ENCOUNTER — Telehealth: Payer: Self-pay | Admitting: Neurology

## 2016-09-29 MED ORDER — METHYLPHENIDATE HCL 10 MG PO TABS: ORAL_TABLET | ORAL | 0 refills | Status: DC

## 2016-09-29 MED ORDER — OXYCODONE-ACETAMINOPHEN 10-325 MG PO TABS: ORAL_TABLET | ORAL | 0 refills | Status: DC

## 2016-09-29 NOTE — Telephone Encounter (Signed)
Rx's up front [facility]/fim 

## 2016-09-29 NOTE — Telephone Encounter (Signed)
Patient called office requesting refills for methylphenidate (RITALIN) 10 MG tablet and oxyCODONE-acetaminophen (PERCOCET) 10-325 MG tablet

## 2016-09-29 NOTE — Addendum Note (Signed)
Addended by: Candis Schatz I on: 09/29/2016 11:38 AM   Modules accepted: Orders

## 2016-10-27 ENCOUNTER — Telehealth: Payer: Self-pay | Admitting: Neurology

## 2016-10-27 MED ORDER — OXYCODONE-ACETAMINOPHEN 10-325 MG PO TABS: ORAL_TABLET | ORAL | 0 refills | Status: DC

## 2016-10-27 MED ORDER — METHYLPHENIDATE HCL 10 MG PO TABS: ORAL_TABLET | ORAL | 0 refills | Status: DC

## 2016-10-27 NOTE — Telephone Encounter (Signed)
Rx. up front [facility]/fim 

## 2016-10-27 NOTE — Telephone Encounter (Signed)
Pt calling for refill of oxyCODONE-acetaminophen (PERCOCET) 10-325 MG tablet methylphenidate (RITALIN) 10 MG tablet

## 2016-10-27 NOTE — Addendum Note (Signed)
Addended by: Candis Schatz I on: 10/27/2016 11:45 AM   Modules accepted: Orders

## 2016-10-27 NOTE — Telephone Encounter (Signed)
Rx's awaiting RAS sig/fim 

## 2016-11-24 ENCOUNTER — Telehealth: Payer: Self-pay | Admitting: Neurology

## 2016-11-24 MED ORDER — METHYLPHENIDATE HCL 10 MG PO TABS: ORAL_TABLET | ORAL | 0 refills | Status: DC

## 2016-11-24 MED ORDER — OXYCODONE-ACETAMINOPHEN 10-325 MG PO TABS: ORAL_TABLET | ORAL | 0 refills | Status: DC

## 2016-11-24 NOTE — Addendum Note (Signed)
Addended by: Candis Schatz I on: 11/24/2016 03:15 PM   Modules accepted: Orders

## 2016-11-24 NOTE — Telephone Encounter (Signed)
Patient requesting refill of methylphenidate (RITALIN) 10 MG tablet and oxyCODONE-acetaminophen (PERCOCET) 10-325 MG tablet. ° ° °

## 2016-11-24 NOTE — Telephone Encounter (Signed)
Rx's up front [facility]/fim 

## 2016-11-25 ENCOUNTER — Ambulatory Visit (INDEPENDENT_AMBULATORY_CARE_PROVIDER_SITE_OTHER): Payer: Medicare Other | Admitting: Neurology

## 2016-11-25 ENCOUNTER — Encounter: Payer: Self-pay | Admitting: Neurology

## 2016-11-25 VITALS — BP 126/73 | HR 67 | Resp 16 | Ht 66.0 in | Wt 140.0 lb

## 2016-11-25 DIAGNOSIS — M545 Low back pain, unspecified: Secondary | ICD-10-CM

## 2016-11-25 DIAGNOSIS — Z79899 Other long term (current) drug therapy: Secondary | ICD-10-CM | POA: Diagnosis not present

## 2016-11-25 DIAGNOSIS — R260 Ataxic gait: Secondary | ICD-10-CM

## 2016-11-25 DIAGNOSIS — R5383 Other fatigue: Secondary | ICD-10-CM

## 2016-11-25 DIAGNOSIS — G35 Multiple sclerosis: Secondary | ICD-10-CM | POA: Diagnosis not present

## 2016-11-25 MED ORDER — METHYLPHENIDATE HCL 10 MG PO TABS: ORAL_TABLET | ORAL | 0 refills | Status: DC

## 2016-11-25 MED ORDER — OXYCODONE-ACETAMINOPHEN 10-325 MG PO TABS: ORAL_TABLET | ORAL | 0 refills | Status: DC

## 2016-11-25 NOTE — Progress Notes (Signed)
[facility]  PATIENT: Zachary Rasmussen DOB: [birth year]  REFERRING CLINICIAN: No primary care physician HISTORY FROM: Patient REASON FOR VISIT: Multiple sclerosis and worsening symptoms    HISTORICAL  CHIEF COMPLAINT:  Chief Complaint  Patient presents with  . Multiple Sclerosis    He is scheduled for his 6 moi. Ocrevus infusion on 12/01/16.  Denies new or worsening sx/fim    HISTORY OF PRESENT ILLNESS:  Zachary Rasmussen is a 51 yo man with multiple sclerosis.      MS:    He had his first split dose of ocrelizumab in December 2017. He tolerated it without any complications. He feels that his MS has been stable since he had the ocrelizumab. He is due to have his next dose in about a week.    He was switched from Aubagio to ocrelizumab due to significant changes on MRI.   MRI of the brain that he had performed 03/23/2016 showed at least 6 foci in the hemispheres in the pons not present on his 2016 MRI including one enhancing focus in the left temporoparietal lobe.      He had previously switched from Tecfidera to Unity after the 2016 MRI showed several new lesions.   Also in the past, he has been on Betaseron, Plegridy and Copaxone.  Gait/strength/sensation:  He feels his gait is stable. He notes that his balance is poor compared to a year or 2 ago but that it has not worsened any this year. He has not had any falls though he stumbles. Strength is good in the arms or legs. He does note some numbness in the left face and also in the right foot. Occasionally the hands feel numb but never more than a few minutes at a time.    Vision:     He feels vision is doing mildly worse and that it seems ot be slowly changing.   I advised him to se his eye doctor.      Reading glasses help but incompletely.    Fatigue/sleep:    He has fatigue that is worse in the afternoons. Ritalin has greatly helped the fatigue. Sleep is poor some night due to difficulty falling asleep about once a  week  Bladder:   He has mild urinary frequency.    He denies nocturia or any UTI's.   .   Mood/Cognitoin:   He feels mood is doing well. He felt worse when he was on Plegridy and Betaseron in the past.    He notes reduced cognition with  poor attention and  word finding difficulty.    Ritalin has helped the cognitive issues a little bit.  Pain: He continues to experience pain in the lower back and right hip    He also has neck pain.    He feels that oxycodone has significantly helped the pain.   He has not escalated his dose any.    MS History:    He presented initially with optic neuritis on the right. He got much better but continues to have some blurry vision out of the right eye. About the same time, he also began to notice that his gait was a little off balance. Although the vision has pretty much been stable, the gait has worsened over the years. He also has had worsening difficulties with attention and with verbal fluency and with memory.    He also notes numbness in the left face with tingling.    He reports difficulty with fatigue and  gets benefit from IV steroid.   However, over the last year, the benefit has been less and there was concern for bone loss.   .   He got more benefit from Ritalin .  He did misuse it (taking higher doses in the past and then running out early) and it was discontinued a few years ago.   He was initially placed on Betaseron and switched to Plegridy to have fewer number of injections. While on these medicines he has not had any clearcut exacerbations though he has had some progression of his symptoms.   He tried to quit her but stopped it due to tolerability issues.   REVIEW OF SYSTEMS:  Constitutional: No fevers, chills, sweats, or change in appetite.  Has fatigue Eyes: No new visual changes, double vision, eye pain Ear, nose and throat: No hearing loss, ear pain, nasal congestion, sore throat Cardiovascular: No chest pain, palpitations Respiratory:  No shortness  of breath at rest or with exertion.   No wheezes GastrointestinaI: No nausea, vomiting, diarrhea, abdominal pain, fecal incontinence Genitourinary:  See above    No nocturia. Musculoskeletal:  No neck pan.   He continues to have back pain even after surgery.   Integumentary: No rash, pruritus, skin lesions Neurological: as above Psychiatric: see above. Endocrine: No palpitations, diaphoresis, change in appetite, change in weigh or increased thirst Hematologic/Lymphatic:  No anemia, purpura, petechiae. Allergic/Immunologic: No itchy/runny eyes, nasal congestion, recent allergic reactions, rashes  ALLERGIES: Allergies  Allergen Reactions  . Penicillins Rash    HOME MEDICATIONS: See Med List  PAST MEDICAL HISTORY: Past Medical History:  Diagnosis Date  . Fatigue 06/24/2014  . Multiple sclerosis (HCC) 06/24/2014    PAST SURGICAL HISTORY: Past Surgical History:  Procedure Laterality Date  . BACK SURGERY      FAMILY HISTORY: Family History  Problem Relation Age of Onset  . Rheum arthritis Father   . Asthma Son   . Cancer Paternal Grandfather   . Heart failure Son   . Hyperlipidemia Father   . Hypertension Father   . Hypertension Mother   . Thyroid disease Neg Hx   . Stroke Neg Hx   . Seizures Neg Hx   . Rashes / Skin problems Neg Hx   . Migraines Neg Hx   . Diabetes Neg Hx     SOCIAL HISTORY:  Social History   Social History  . Marital status: Single    Spouse name: N/A  . Number of children: N/A  . Years of education: N/A   Occupational History  . Not on file.   Social History Main Topics  . Smoking status: Current Every Day Smoker    Packs/day: 0.75    Years: 35.00    Types: Cigarettes  . Smokeless tobacco: Never Used  . Alcohol use Not on file  . Drug use: No  . Sexual activity: Not on file   Other Topics Concern  . Not on file   Social History Narrative  . No narrative on file     PHYSICAL EXAM  Vitals:   11/25/16 1456  BP: 126/73   Pulse: 67  Resp: 16  Weight: 140 lb (63.5 kg)  Height: 5\' 6"  (1.676 m)    Body mass index is 22.6 kg/m.   General: The patient is well-developed and well-nourished and in no acute distress  Fundoscopic exam shows normal discs and vessels.     Neurologic Exam  Mental status: The patient is alert and oriented x 3 at  the time of the examination. The patient has apparent normal recent and remote memory, with an apparently normal attention span and concentration ability.   Speech is normal.  Cranial nerves: Extraocular movements are full.  Pupils show 1+ left APD.   He has reduced color saturation out of the left eye. Facial strength is normal and symmetric. He reports mild reduced left facial sensation.  Trapezius and sternocleidomastoid strength is normal. No dysarthria is noted.   No obvious hearing deficits are noted.  Motor:  Muscle bulk is normal. Tone is mildly increased in the legs. Strength is  5 / 5 in all 4 extremities.   Sensory: Sensory testing is intact to touch on all 4 extremities.  Coordination: Cerebellar testing reveals good finger-nose-finger and slightl reduced heel-to-shin bilaterally.  Gait and station: Station is stable.  The gait is mildly wide. Tandem gait is moderately wide. Romberg is negative.   Reflexes: Deep tendon reflexes are symmetric and increasd in legs with spread at the knees.   No ankle clonus.     DIAGNOSTIC DATA (LABS, IMAGING, TESTING) - I reviewed patient records, labs, notes, testing and imaging myself where available.     ASSESSMENT AND PLAN  Multiple sclerosis (HCC)  Ataxic gait  Other fatigue  Midline low back pain without sciatica, unspecified chronicity  High risk medication use  1.   Continue ocrelizumab for MS..  2.   Refill methylphenidate and oxycodone 3.   Stay active.   Exercise as tolerated 4.   rtc 5 months, sooner if problems.      Tydarius Yawn A. Epimenio Foot, MD, PhD 11/25/2016, 3:28 PM Certified in Neurology,  Clinical Neurophysiology, Sleep Medicine, Pain Medicine and Neuroimaging  [facility] [address], [address] [place], [place] [address] [phone]  [facility]  PATIENT: Surafel Hilleary DOB: 31-Jul-1965  REFERRING CLINICIAN: No primary care physician HISTORY FROM: Patient REASON FOR VISIT: Multiple sclerosis and worsening symptoms    HISTORICAL  CHIEF COMPLAINT:  Chief Complaint  Patient presents with  . Multiple Sclerosis    He is scheduled for his 6 moi. Ocrevus infusion on 12/01/16.  Denies new or worsening sx/fim    HISTORY OF PRESENT ILLNESS:  Leno Mathes is a 51 yo man with multiple sclerosis.      MS:    MRI of the brain that he had performed 03/23/2016 showed at least 6 foci in the hemispheres in the pons not present on his 2016 MRI including one enhancing focus in the left temporoparietal lobe.    Because of that finding, he switched from Aubagio to ocrelizumab and he had his first infusion (first half of first dose)  a couple weeks ago and returns later this week for his second infusion.   He has tolerated well.  He had previously switched from Tecfidera to Plaza after the 2016 MRI showed several new lesions.   Gait/strength/sensation:   He notes balance worsened last year but is no worse today than a couple  months ago.   He has no falls.    He feels strength is good.   He reports mild numbness in the left face (better).    He also has mild right leg/foot numbness (but has   lumbar surgery a couple years ago).   Occasionally the hands feel numb but never more than a few minutes at a time.    Vision:     He notes vision has declined over the past few years .   He is going to see a new ophthalmologist soon.     Reading glasses help but incompletely.    Fatigue/sleep:    He reports fatigue, worse in the afternoons.     Fatigue is better with ritalin.   Sleep is poor some night due to difficulty falling asleep about once a week  Bladder:   He has mild urinary frequency.    He denies nocturia or any UTI's.   .   Mood/Cognitoin:   He reports mood is better on Aubagio than on other MS drugs.     He notes some anxiety .  He notes reduced cognition with  poor attention and  word finding difficulty.    Ritalin has helped the cognitive issues a little bit.  Pain: He has moderate pain in the lower back and right hip.   Also notes some pain in the neck. He feels that oxycodone has significantly helped the pain.    MS History:    He presented initially with optic neuritis on the right. He got much better but continues to have some blurry vision out of the right eye. About the same time, he also began to notice that his gait was a little off balance. Although the vision has pretty much been stable, the gait has worsened over the years. He also has had worsening difficulties with attention and with verbal fluency and with memory.    He also notes numbness in the left face with tingling.    He reports difficulty with fatigue and gets benefit from IV steroid.   However, over the last year, the benefit has been less and there was concern for bone loss.   .   He got more benefit from Ritalin .  He did misuse it (taking higher doses  in the past and then running out early) and it was discontinued a few years ago.   He was  initially placed on Betaseron and switched to Plegridy to have fewer number of injections. While on these medicines he has not had any clearcut exacerbations though he has had some progression of his symptoms.   He tried to quit her but stopped it due to tolerability issues.   REVIEW OF SYSTEMS:  Constitutional: No fevers, chills, sweats, or change in appetite.  Has fatigue Eyes: No new visual changes, double vision, eye pain Ear, nose and throat: No hearing loss, ear pain, nasal congestion, sore throat Cardiovascular: No chest pain, palpitations Respiratory:  No shortness of breath at rest or with exertion.   No wheezes GastrointestinaI: No nausea, vomiting, diarrhea, abdominal pain, fecal incontinence Genitourinary:  See above    No nocturia. Musculoskeletal:  No neck pan.   He continues to have back pain even after surgery.   Integumentary: No rash, pruritus, skin lesions Neurological: as above Psychiatric: see above. Endocrine: No palpitations, diaphoresis, change in appetite, change in weigh or increased thirst Hematologic/Lymphatic:  No anemia, purpura, petechiae. Allergic/Immunologic: No itchy/runny eyes, nasal congestion, recent allergic reactions, rashes  ALLERGIES: Allergies  Allergen Reactions  . Penicillins Rash    HOME MEDICATIONS: See Med List  PAST MEDICAL HISTORY: Past Medical History:  Diagnosis Date  . Fatigue 06/24/2014  . Multiple sclerosis (HCC) 06/24/2014    PAST SURGICAL HISTORY: Past Surgical History:  Procedure Laterality Date  . BACK SURGERY      FAMILY HISTORY: Family History  Problem Relation Age of Onset  . Rheum arthritis Father   . Asthma Son   . Cancer Paternal Grandfather   . Heart failure Son   . Hyperlipidemia Father   . Hypertension Father   . Hypertension Mother   . Thyroid disease Neg Hx   . Stroke Neg Hx   . Seizures Neg Hx   . Rashes / Skin problems Neg Hx   . Migraines Neg Hx   . Diabetes Neg Hx     SOCIAL  HISTORY:  Social History   Social History  . Marital status: Single    Spouse name: N/A  . Number of children: N/A  . Years of education: N/A   Occupational History  . Not on file.   Social History Main Topics  . Smoking status: Current Every Day Smoker    Packs/day: 0.75    Years: 35.00    Types: Cigarettes  . Smokeless tobacco: Never Used  . Alcohol use Not on file  . Drug use: No  . Sexual activity: Not on file   Other Topics Concern  . Not on file   Social History Narrative  . No narrative on file     PHYSICAL EXAM  Vitals:   11/25/16 1456  BP: 126/73  Pulse: 67  Resp: 16  Weight: 140 lb (63.5 kg)  Height: 5\' 6"  (1.676 m)    Body mass index is 22.6 kg/m.   General: The patient is well-developed and well-nourished and in no acute distress  Fundoscopic exam shows normal discs and vessels.     Neurologic Exam  Mental status: The patient is alert and oriented x 3 at the time of the examination. The patient has apparent normal recent and remote memory, with an apparently normal attention span and concentration ability.   Speech is normal.  Cranial nerves: Extraocular movements are full.  Pupils show 1+ left APD.   Facial  symmetry is present. There is reduced color saturation out of left eye.   left facial sensation Facial strength is normal.  Trapezius and sternocleidomastoid strength is normal. No dysarthria is noted.   No obvious hearing deficits are noted.  Motor:  Muscle bulk is normal. Tone is mildly increased in the legs. Strength is  5 / 5 in all 4 extremities.   Sensory: Sensory testing is intact to touch on all 4 extremities.  Coordination: Cerebellar testing reveals good finger-nose-finger and slightl reduced heel-to-shin bilaterally.  Gait and station: Station is stable.  The gait is mildly wide and the tandem gait is moderately wide (improved from last visit). Romberg is negative.   Reflexes: Deep tendon reflexes are symmetric and increasd in  legs with spread at the knees.   No ankle clonus.     DIAGNOSTIC DATA (LABS, IMAGING, TESTING) - I reviewed patient records, labs, notes, testing and imaging myself where available.     ASSESSMENT AND PLAN  Multiple sclerosis (HCC)  Ataxic gait  Other fatigue  Midline low back pain without sciatica, unspecified chronicity  High risk medication use  1.   He will have his next dose of ocrelizumab later this month. We'll check another MRI towards the end of this year early next year.   to determine if any subclinicaal progression.   If present, consider Lemtrada.   2.   Refill methylphenidate and oxycodone 3.   Stay active.   Exercise as tolerated 4.   rtc 5 months, sooner if problems.      Asaiah Scarber A. Epimenio Foot, MD, PhD 11/25/2016, 3:28 PM Certified in Neurology, Clinical Neurophysiology, Sleep Medicine, Pain Medicine and Neuroimaging  [facility] [address] [place], [place] [address] [phone]

## 2016-11-26 DIAGNOSIS — I10 Essential (primary) hypertension: Secondary | ICD-10-CM | POA: Diagnosis not present

## 2016-11-26 DIAGNOSIS — Z6821 Body mass index (BMI) 21.0-21.9, adult: Secondary | ICD-10-CM | POA: Diagnosis not present

## 2016-12-14 ENCOUNTER — Telehealth: Payer: Self-pay | Admitting: Neurology

## 2016-12-14 NOTE — Telephone Encounter (Signed)
error 

## 2016-12-28 DIAGNOSIS — G35 Multiple sclerosis: Secondary | ICD-10-CM | POA: Diagnosis not present

## 2017-01-26 ENCOUNTER — Telehealth: Payer: Self-pay | Admitting: Neurology

## 2017-01-26 MED ORDER — METHYLPHENIDATE HCL 10 MG PO TABS: 10.0000 mg | ORAL_TABLET | Freq: Three times a day (TID) | ORAL | 0 refills | Status: DC

## 2017-01-26 MED ORDER — OXYCODONE-ACETAMINOPHEN 10-325 MG PO TABS: ORAL_TABLET | ORAL | 0 refills | Status: DC

## 2017-01-26 NOTE — Telephone Encounter (Signed)
Pt calling for refill of oxyCODONE-acetaminophen (PERCOCET) 10-325 MG tablet °methylphenidate (RITALIN) 10 MG tablet °

## 2017-01-26 NOTE — Addendum Note (Signed)
Addended by: Candis Schatz I on: 01/26/2017 02:50 PM   Modules accepted: Orders

## 2017-01-26 NOTE — Telephone Encounter (Signed)
Rx's awaiting YY sig, as RAS is ooo this wk/fim 

## 2017-01-26 NOTE — Telephone Encounter (Signed)
Rx's up front [facility]/fim 
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
## 2017-02-07 NOTE — Telephone Encounter (Signed)
I have spoken with Zachary Rasmussen this afternoon and per RAS, explained that he is not able to replace rx's for controlled substances early. Pt. verbalized understanding of same, asks if he can get meds from the ER. I have explained that if he is admitted to the hospital, they can give meds to control pain during hospitalization.  He should not accept controlled substances from the ER, or other outpatient facility.  If he does, [facility] may not be able to rx. controlled substances for him in the future.  He verbalized understanding of same/fim

## 2017-02-28 ENCOUNTER — Telehealth: Payer: Self-pay | Admitting: Neurology

## 2017-02-28 MED ORDER — METHYLPHENIDATE HCL 10 MG PO TABS: 10.0000 mg | ORAL_TABLET | Freq: Three times a day (TID) | ORAL | 0 refills | Status: DC

## 2017-02-28 MED ORDER — OXYCODONE-ACETAMINOPHEN 10-325 MG PO TABS: ORAL_TABLET | ORAL | 0 refills | Status: DC

## 2017-02-28 NOTE — Telephone Encounter (Signed)
Rx's awaiting RAS sig/fim 

## 2017-02-28 NOTE — Telephone Encounter (Signed)
Rx's up front [facility]/fim 

## 2017-02-28 NOTE — Telephone Encounter (Signed)
Pt request refill for oxyCODONE-acetaminophen (PERCOCET) 10-325 MG tablet and methylphenidate (RITALIN) 10 MG tablet . Pt said he will pay $48  balance tomorrow also.

## 2017-03-29 ENCOUNTER — Ambulatory Visit: Payer: Self-pay | Admitting: Neurology

## 2017-03-29 ENCOUNTER — Telehealth: Payer: Self-pay | Admitting: Neurology

## 2017-03-29 MED ORDER — METHYLPHENIDATE HCL 10 MG PO TABS: 10.0000 mg | ORAL_TABLET | Freq: Three times a day (TID) | ORAL | 0 refills | Status: DC

## 2017-03-29 MED ORDER — OXYCODONE-ACETAMINOPHEN 10-325 MG PO TABS: ORAL_TABLET | ORAL | 0 refills | Status: DC

## 2017-03-29 NOTE — Telephone Encounter (Signed)
Rx's awaiting RAS sig/fim 

## 2017-03-29 NOTE — Telephone Encounter (Signed)
Patient called to state he wont be able to make his 3:00 apt today due to finances. He would like to know if Dr. Epimenio Foot would get his percocet and ritalin refills available for him to pick up tomorrow.

## 2017-03-29 NOTE — Telephone Encounter (Signed)
Rx's up front [facility]/fim 

## 2017-03-30 ENCOUNTER — Encounter: Payer: Self-pay | Admitting: Neurology

## 2017-04-13 ENCOUNTER — Ambulatory Visit (INDEPENDENT_AMBULATORY_CARE_PROVIDER_SITE_OTHER): Payer: Medicare Other | Admitting: Neurology

## 2017-04-13 ENCOUNTER — Encounter: Payer: Self-pay | Admitting: Neurology

## 2017-04-13 VITALS — BP 152/89 | HR 67 | Resp 18 | Ht 66.0 in | Wt 137.5 lb

## 2017-04-13 DIAGNOSIS — F418 Other specified anxiety disorders: Secondary | ICD-10-CM | POA: Diagnosis not present

## 2017-04-13 DIAGNOSIS — Z79899 Other long term (current) drug therapy: Secondary | ICD-10-CM | POA: Diagnosis not present

## 2017-04-13 DIAGNOSIS — R260 Ataxic gait: Secondary | ICD-10-CM

## 2017-04-13 DIAGNOSIS — R5383 Other fatigue: Secondary | ICD-10-CM | POA: Diagnosis not present

## 2017-04-13 DIAGNOSIS — S0990XA Unspecified injury of head, initial encounter: Secondary | ICD-10-CM

## 2017-04-13 DIAGNOSIS — G35 Multiple sclerosis: Secondary | ICD-10-CM

## 2017-04-13 DIAGNOSIS — M545 Low back pain, unspecified: Secondary | ICD-10-CM

## 2017-04-13 MED ORDER — OXYCODONE-ACETAMINOPHEN 10-325 MG PO TABS: ORAL_TABLET | ORAL | 0 refills | Status: DC

## 2017-04-13 MED ORDER — ESCITALOPRAM OXALATE 20 MG PO TABS: 20.0000 mg | ORAL_TABLET | Freq: Every day | ORAL | 11 refills | Status: DC

## 2017-04-13 MED ORDER — METHYLPHENIDATE HCL 10 MG PO TABS: 10.0000 mg | ORAL_TABLET | Freq: Three times a day (TID) | ORAL | 0 refills | Status: DC
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
## 2017-04-14 ENCOUNTER — Telehealth: Payer: Self-pay

## 2017-04-14 LAB — CBC WITH DIFFERENTIAL/PLATELET
BASOS ABS: 0.1 10*3/uL (ref 0.0–0.2)
Basos: 1 %
EOS (ABSOLUTE): 0.4 10*3/uL (ref 0.0–0.4)
EOS: 5 %
Hematocrit: 40 % (ref 37.5–51.0)
Hemoglobin: 13.4 g/dL (ref 13.0–17.7)
IMMATURE GRANULOCYTES: 0 %
Immature Grans (Abs): 0 10*3/uL (ref 0.0–0.1)
LYMPHS ABS: 1.8 10*3/uL (ref 0.7–3.1)
Lymphs: 24 %
MCH: 31.7 pg (ref 26.6–33.0)
MCHC: 33.5 g/dL (ref 31.5–35.7)
MCV: 95 fL (ref 79–97)
MONOS ABS: 0.5 10*3/uL (ref 0.1–0.9)
Monocytes: 7 %
NEUTROS PCT: 63 %
Neutrophils Absolute: 4.7 10*3/uL (ref 1.4–7.0)
PLATELETS: 351 10*3/uL (ref 150–379)
RBC: 4.23 x10E6/uL (ref 4.14–5.80)
RDW: 13.4 % (ref 12.3–15.4)
WBC: 7.4 10*3/uL (ref 3.4–10.8)

## 2017-04-14 LAB — IGG, IGA, IGM
IGA/IMMUNOGLOBULIN A, SERUM: 290 mg/dL (ref 90–386)
IGM (IMMUNOGLOBULIN M), SRM: 34 mg/dL (ref 20–172)
IgG (Immunoglobin G), Serum: 986 mg/dL (ref 700–1600)

## 2017-04-14 NOTE — Telephone Encounter (Signed)
-----   Message from Asa Lente, MD sent at 04/14/2017  2:23 PM EDT ----- Please let the patient know that the lab work is fine.

## 2017-04-14 NOTE — Telephone Encounter (Signed)
I spoke with patient and made him aware of lab results.

## 2017-04-25 ENCOUNTER — Other Ambulatory Visit: Payer: Medicare Other

## 2017-04-25 ENCOUNTER — Inpatient Hospital Stay: Admission: RE | Admit: 2017-04-25 | Payer: Medicare Other | Source: Ambulatory Visit

## 2017-05-09 ENCOUNTER — Ambulatory Visit
Admission: RE | Admit: 2017-05-09 | Discharge: 2017-05-09 | Disposition: A | Discharge status: Home / Self Care | Payer: Medicare Other | Source: Ambulatory Visit | Attending: Neurology | Admitting: Neurology

## 2017-05-09 DIAGNOSIS — G35 Multiple sclerosis: Secondary | ICD-10-CM

## 2017-05-09 MED ORDER — GADOBENATE DIMEGLUMINE 529 MG/ML IV SOLN
13.0000 mL | Freq: Once | INTRAVENOUS | Status: AC | PRN
  Administered 2017-05-09: 13 mL via INTRAVENOUS

## 2017-05-10 ENCOUNTER — Telehealth: Payer: Self-pay | Admitting: *Deleted

## 2017-05-10 NOTE — Telephone Encounter (Signed)
-----   Message from Asa Lente, MD sent at 05/09/2017  6:45 PM EST ----- Please let him know that the MRi  Of the brain did not show any new lesions and the large lesion that was new in 2017 looks better.    The spinal cord shows no MS lesions but he has disc protrusions more towards the left at [facility] and [id number] -- not bad enough for surgery.

## 2017-05-10 NOTE — Telephone Encounter (Signed)
LMOM with below MRI results.  He does not need to return this call unless he has questions/fim 

## 2017-05-11 ENCOUNTER — Telehealth: Payer: Self-pay | Admitting: Neurology

## 2017-05-11 ENCOUNTER — Encounter: Payer: Self-pay | Admitting: Neurology

## 2017-05-11 NOTE — Telephone Encounter (Signed)
Pt is asking for a call from Dr Epimenio Foot re: an upcoming court date, pt did not want to state more in message
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
## 2017-05-25 ENCOUNTER — Other Ambulatory Visit: Payer: Self-pay | Admitting: Neurology

## 2017-05-25 MED ORDER — METHYLPHENIDATE HCL 10 MG PO TABS: 10.0000 mg | ORAL_TABLET | Freq: Three times a day (TID) | ORAL | 0 refills | Status: DC

## 2017-05-25 MED ORDER — OXYCODONE-ACETAMINOPHEN 10-325 MG PO TABS: ORAL_TABLET | ORAL | 0 refills | Status: DC

## 2017-05-25 NOTE — Telephone Encounter (Signed)
Pt calling for a refill for  oxyCODONE-acetaminophen (PERCOCET) 10-325 MG tablet  methylphenidate (RITALIN) 10 MG tablet

## 2017-05-25 NOTE — Telephone Encounter (Signed)
Both prescriptions signed and placed up front for pick up.

## 2017-05-25 NOTE — Addendum Note (Signed)
Addended by: Candis Schatz I on: 05/25/2017 03:22 PM   Modules accepted: Orders

## 2017-05-25 NOTE — Telephone Encounter (Signed)
Rx's awaiting RAS sig/fim 

## 2017-06-27 ENCOUNTER — Telehealth: Payer: Self-pay | Admitting: Neurology

## 2017-06-27 MED ORDER — METHYLPHENIDATE HCL 10 MG PO TABS: 10.0000 mg | ORAL_TABLET | Freq: Three times a day (TID) | ORAL | 0 refills | Status: DC

## 2017-06-27 MED ORDER — OXYCODONE-ACETAMINOPHEN 10-325 MG PO TABS: ORAL_TABLET | ORAL | 0 refills | Status: DC

## 2017-06-27 NOTE — Telephone Encounter (Signed)
Pt reqeust refill for oxy CODONE-acetaminophen (PERCOCET) 10-325 MG tablet and methylphenidate (RITALIN) 10 MG tablet

## 2017-06-27 NOTE — Telephone Encounter (Signed)
Ritalin and Oxycodone rx's up front [facility]/fim 

## 2017-06-27 NOTE — Telephone Encounter (Signed)
Rx's awaiting RAS sig/fim 

## 2017-07-20 NOTE — Telephone Encounter (Signed)
Pt requesting a refill for oxyCODONE-acetaminophen (PERCOCET) 10-325 MG tablet and methylphenidate (RITALIN) 10 MG tablet

## 2017-07-21 ENCOUNTER — Telehealth: Payer: Self-pay | Admitting: *Deleted

## 2017-07-21 ENCOUNTER — Other Ambulatory Visit: Payer: Self-pay | Admitting: *Deleted

## 2017-07-21 MED ORDER — METHYLPHENIDATE HCL 10 MG PO TABS: 10.0000 mg | ORAL_TABLET | Freq: Three times a day (TID) | ORAL | 0 refills | Status: DC

## 2017-07-21 MED ORDER — BUSPIRONE HCL 30 MG PO TABS: ORAL_TABLET | ORAL | 0 refills | Status: DC

## 2017-07-21 MED ORDER — OXYCODONE-ACETAMINOPHEN 10-325 MG PO TABS: ORAL_TABLET | ORAL | 0 refills | Status: DC

## 2017-07-21 NOTE — Telephone Encounter (Signed)
Pt. in the office for Ocrevus infusion; sts. he stopped Lexapro after 2 wks b/c it caused him to have a "short fuse." Per RAS, ok for Buspar 15mg  po qd. Due to shortage of 15mg  tablets, Buspar 30mg  1/2 tab po qd escribed to [facility]. Pt. aware he is to only take 1/2 tab daily/fim

## 2017-07-21 NOTE — Addendum Note (Signed)
Addended by: Candis Schatz I on: 07/21/2017 09:17 AM   Modules accepted: Orders

## 2017-07-21 NOTE — Telephone Encounter (Signed)
Rx's awaiting RAS sig/fim 

## 2017-07-21 NOTE — Telephone Encounter (Signed)
Rx's up front [facility]/fim 

## 2017-08-18 ENCOUNTER — Telehealth: Payer: Self-pay | Admitting: Neurology

## 2017-08-18 NOTE — Telephone Encounter (Signed)
Pt requesting a refill for oxyCODONE-acetaminophen (PERCOCET) 10-325 MG tablet stating he will pick it up on Monday 3/11

## 2017-08-19 MED ORDER — OXYCODONE-ACETAMINOPHEN 10-325 MG PO TABS: ORAL_TABLET | ORAL | 0 refills | Status: DC

## 2017-08-19 NOTE — Telephone Encounter (Signed)
Rx. up front [facility]/fim 

## 2017-08-19 NOTE — Telephone Encounter (Signed)
Rx. awaiting RAS sig/fim 

## 2017-08-22 ENCOUNTER — Telehealth: Payer: Self-pay | Admitting: Neurology

## 2017-08-22 MED ORDER — METHYLPHENIDATE HCL 10 MG PO TABS: 10.0000 mg | ORAL_TABLET | Freq: Three times a day (TID) | ORAL | 0 refills | Status: DC

## 2017-08-22 NOTE — Telephone Encounter (Signed)
Rx. up front [facility]/fim 

## 2017-08-22 NOTE — Telephone Encounter (Signed)
Rx. awaiting RAS sig/fim 

## 2017-08-22 NOTE — Telephone Encounter (Signed)
Pt request refill for methylphenidate (RITALIN) 10 MG tablet. Pt said he requested it with percocet but it is not documented. Pt is wanting to pick up the RX's this afternoon please. He will call back around 3pm today.

## 2017-09-26 ENCOUNTER — Telehealth: Payer: Self-pay | Admitting: Neurology

## 2017-09-26 MED ORDER — OXYCODONE-ACETAMINOPHEN 10-325 MG PO TABS: ORAL_TABLET | ORAL | 0 refills | Status: DC

## 2017-09-26 MED ORDER — METHYLPHENIDATE HCL 10 MG PO TABS: 10.0000 mg | ORAL_TABLET | Freq: Three times a day (TID) | ORAL | 0 refills | Status: DC

## 2017-09-26 NOTE — Addendum Note (Signed)
Addended by: Candis Schatz I on: 09/26/2017 09:43 AM   Modules accepted: Orders

## 2017-09-26 NOTE — Telephone Encounter (Signed)
Rx's up front [facility]/fim 

## 2017-09-26 NOTE — Telephone Encounter (Signed)
Patient's fiance' requesting refill of *oxyCODONE-acetaminophen (PERCOCET) 10-325 MG tablet and methylphenidate (RITALIN) 10 MG tablet.

## 2017-09-26 NOTE — Telephone Encounter (Signed)
Rx's awaiting RAS sig/fim 

## 2017-10-11 ENCOUNTER — Encounter: Payer: Self-pay | Admitting: Neurology

## 2017-10-11 ENCOUNTER — Ambulatory Visit: Payer: Medicare Other | Admitting: Neurology

## 2017-10-24 ENCOUNTER — Telehealth: Payer: Self-pay | Admitting: Neurology

## 2017-10-24 MED ORDER — OXYCODONE-ACETAMINOPHEN 10-325 MG PO TABS: ORAL_TABLET | ORAL | 0 refills | Status: DC

## 2017-10-24 MED ORDER — METHYLPHENIDATE HCL 10 MG PO TABS: 10.0000 mg | ORAL_TABLET | Freq: Three times a day (TID) | ORAL | 0 refills | Status: DC

## 2017-10-24 NOTE — Telephone Encounter (Signed)
Rx's up front [facility]/fim 

## 2017-10-24 NOTE — Telephone Encounter (Signed)
Pt request refill for methylphenidate (RITALIN) 10 MG tablet and oxyCODONE-acetaminophen (PERCOCET) 10-325 MG tablet. Please sendmethylphenidate (RITALIN) 10 MG tablet sent to [facility].

## 2017-10-24 NOTE — Telephone Encounter (Signed)
Rx's awaiting RAS sig/fim 

## 2017-10-25 ENCOUNTER — Other Ambulatory Visit: Payer: Self-pay | Admitting: Neurology

## 2017-10-26 ENCOUNTER — Telehealth: Payer: Self-pay | Admitting: Neurology

## 2017-10-26 ENCOUNTER — Encounter: Payer: Self-pay | Admitting: Neurology

## 2017-10-26 ENCOUNTER — Ambulatory Visit (INDEPENDENT_AMBULATORY_CARE_PROVIDER_SITE_OTHER): Payer: Medicare Other | Admitting: Neurology

## 2017-10-26 ENCOUNTER — Other Ambulatory Visit: Payer: Self-pay

## 2017-10-26 VITALS — BP 180/88 | HR 98 | Resp 22 | Ht 66.0 in | Wt 129.0 lb

## 2017-10-26 DIAGNOSIS — Z9889 Other specified postprocedural states: Secondary | ICD-10-CM | POA: Diagnosis not present

## 2017-10-26 DIAGNOSIS — G894 Chronic pain syndrome: Secondary | ICD-10-CM | POA: Diagnosis not present

## 2017-10-26 DIAGNOSIS — R200 Anesthesia of skin: Secondary | ICD-10-CM | POA: Diagnosis not present

## 2017-10-26 DIAGNOSIS — Z79899 Other long term (current) drug therapy: Secondary | ICD-10-CM | POA: Diagnosis not present

## 2017-10-26 DIAGNOSIS — G35D Multiple sclerosis, unspecified: Secondary | ICD-10-CM

## 2017-10-26 DIAGNOSIS — G35 Multiple sclerosis: Secondary | ICD-10-CM | POA: Diagnosis not present

## 2017-10-26 DIAGNOSIS — R260 Ataxic gait: Secondary | ICD-10-CM

## 2017-10-26 DIAGNOSIS — M79605 Pain in left leg: Secondary | ICD-10-CM | POA: Diagnosis not present

## 2017-10-26 MED ORDER — GABAPENTIN 300 MG PO CAPS: 300.0000 mg | ORAL_CAPSULE | Freq: Three times a day (TID) | ORAL | 11 refills | Status: DC

## 2017-10-26 MED ORDER — BUSPIRONE HCL 30 MG PO TABS: ORAL_TABLET | ORAL | 5 refills | Status: DC

## 2017-10-26 MED ORDER — OXYCODONE-ACETAMINOPHEN 10-325 MG PO TABS: ORAL_TABLET | ORAL | 0 refills | Status: DC
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
## 2017-10-26 NOTE — Telephone Encounter (Signed)
Medicare order sent to GI. No auth they will reach out to the pt to schedule.  °

## 2017-10-26 NOTE — Patient Instructions (Signed)
Percocet 3 pills  x 3 days, 2 pills  x 3 days, then pill po x 3 d then off  Start gabapentin 300 mg twice a day

## 2017-10-26 NOTE — Telephone Encounter (Signed)
Pt. seen in the office today.  See ov note/fim

## 2017-10-26 NOTE — Telephone Encounter (Signed)
Patient arrived at 10:30 an hour after check in time. He is requesting to be seen or worked in today. Patient states he will wait in the lobby until he gets and answer. Best call back is [phone].

## 2017-11-02 ENCOUNTER — Inpatient Hospital Stay: Admission: RE | Admit: 2017-11-02 | Payer: Medicare Other | Source: Ambulatory Visit

## 2017-11-05 ENCOUNTER — Ambulatory Visit
Admission: RE | Admit: 2017-11-05 | Discharge: 2017-11-05 | Disposition: A | Discharge status: Home / Self Care | Payer: Medicare Other | Source: Ambulatory Visit | Attending: Neurology | Admitting: Neurology

## 2017-11-05 ENCOUNTER — Other Ambulatory Visit: Payer: Self-pay | Admitting: Neurology

## 2017-11-05 DIAGNOSIS — Z9889 Other specified postprocedural states: Secondary | ICD-10-CM

## 2017-11-05 DIAGNOSIS — M79605 Pain in left leg: Secondary | ICD-10-CM

## 2017-11-05 DIAGNOSIS — G35 Multiple sclerosis: Secondary | ICD-10-CM

## 2017-11-08 ENCOUNTER — Other Ambulatory Visit: Payer: Self-pay | Admitting: *Deleted

## 2017-11-08 ENCOUNTER — Other Ambulatory Visit: Payer: Self-pay | Admitting: Neurology

## 2017-11-08 ENCOUNTER — Telehealth: Payer: Self-pay | Admitting: Neurology

## 2017-11-08 DIAGNOSIS — R260 Ataxic gait: Secondary | ICD-10-CM

## 2017-11-08 DIAGNOSIS — G35 Multiple sclerosis: Secondary | ICD-10-CM

## 2017-11-08 DIAGNOSIS — G459 Transient cerebral ischemic attack, unspecified: Secondary | ICD-10-CM | POA: Insufficient documentation

## 2017-11-08 DIAGNOSIS — I639 Cerebral infarction, unspecified: Secondary | ICD-10-CM | POA: Insufficient documentation

## 2017-11-08 DIAGNOSIS — I6340 Cerebral infarction due to embolism of unspecified cerebral artery: Secondary | ICD-10-CM

## 2017-11-08 DIAGNOSIS — S0990XA Unspecified injury of head, initial encounter: Secondary | ICD-10-CM

## 2017-11-08 DIAGNOSIS — I6310 Cerebral infarction due to embolism of unspecified precerebral artery: Secondary | ICD-10-CM

## 2017-11-08 DIAGNOSIS — R200 Anesthesia of skin: Secondary | ICD-10-CM

## 2017-11-08 NOTE — Telephone Encounter (Signed)
LMOM Zachary Rasmussen's mobile # for him to call regarding MRI results/fim

## 2017-11-08 NOTE — Telephone Encounter (Signed)
I tried to reach Mr. Zachary Rasmussen.  The home phone number had a full mailbox.  I left a message on the mobile phone.  Please try to reach him.  The MRI of the brain did not show any new MS lesions.  However, he had a new stroke which can explain some of the symptoms that he had..  The stroke had not shown up on the CT scan when he went to the hospital 6 weeks ago but strokes can take 12 to 24 hours to show up on CT scan.  Because he had a lot of neurologic symptoms that day that was probably when the stroke occurred.  We need to check a carotid Doppler study and an echocardiogram with bubble contrast.  I put both of these orders into epic  The MRI of the lumbar spine showed his previous, and some degenerative changes but no nerve root compression.

## 2017-11-08 NOTE — Telephone Encounter (Signed)
Penny's mobile # no longer in service/fim

## 2017-11-08 NOTE — Telephone Encounter (Signed)
Received message at home # that vm has not been set up./fim

## 2017-11-08 NOTE — Telephone Encounter (Signed)
Spoke with Zachary Rasmussen and reviewed below MRI results, and explained need for carotid duplex and echo to try and determine why he had the stroke.  He verbalized understanding of same, is agreeable to additional testing, and is aware to expect calls to schedule once referrals have been made, ins. has approved/fim

## 2017-11-08 NOTE — Progress Notes (Signed)
Will schedule for carotid doppler studies and ECHO with bubble contrast

## 2017-11-09 ENCOUNTER — Encounter (HOSPITAL_COMMUNITY): Payer: Medicare Other

## 2017-11-16 ENCOUNTER — Other Ambulatory Visit: Payer: Self-pay | Admitting: *Deleted

## 2017-11-16 ENCOUNTER — Inpatient Hospital Stay (HOSPITAL_COMMUNITY): Admission: RE | Admit: 2017-11-16 | Payer: Medicare Other | Source: Ambulatory Visit

## 2017-11-16 DIAGNOSIS — G459 Transient cerebral ischemic attack, unspecified: Secondary | ICD-10-CM

## 2017-11-16 DIAGNOSIS — I63139 Cerebral infarction due to embolism of unspecified carotid artery: Secondary | ICD-10-CM

## 2017-12-01 ENCOUNTER — Ambulatory Visit (HOSPITAL_COMMUNITY): Admission: RE | Admit: 2017-12-01 | Payer: Medicare Other | Source: Ambulatory Visit

## 2018-06-28 ENCOUNTER — Encounter: Payer: Self-pay | Admitting: *Deleted

## 2018-06-28 ENCOUNTER — Telehealth: Payer: Self-pay | Admitting: *Deleted

## 2018-06-28 NOTE — Telephone Encounter (Signed)
Called, LVM for pt to call office on the following number: [phone], [phone].  Also tried calling two numbers listed for [facility]: [phone], [phone]. Both numbers not working.   Pt last seen 10/2017. He has no pending appt. Needs to schedule f/u with Dr. Epimenio Foot. Inetta Fermo with [facility] has been unable to reach him to schedule his infusion also. He is past due for infusion.  We received fax notification from [facility] that they were unable to reach pt also. He needs to contact them back at [phone].   I will send pt letter as well to contact our office.

## 2019-01-09 ENCOUNTER — Telehealth: Payer: Self-pay | Admitting: Neurology

## 2019-01-09 NOTE — Telephone Encounter (Signed)
I called and spoke with pt. Pt connected me with fiance. She insisted Dr. Felecia Shelling provide letter without seeing pt in office. Asked to speak with him. States he has to go to court and needs this letter asap.  I advised it would be best to schedule a f/u since he has not been seen in over a year. States he has a balance to pay, I connected them to billing and they spoke with Gaynell. She explained we cannot make appt until payment made. Per Darliss Ridgel, they will make payment tomorrow and she will let me know once this is done

## 2019-01-09 NOTE — Telephone Encounter (Signed)
Pt is requesting a cal back , and states he needs the call back or Dr Leonie Man will have to go to court  [phone]

## 2019-01-10 NOTE — Telephone Encounter (Signed)
Gaynell notified me that pt made payment. Scheduled pt for 01/11/19 at 4pm with Dr. Felecia Shelling. Asked that he check in by 330pm, wear a mask and temp will be checked. He verbalized understanding.

## 2019-01-11 ENCOUNTER — Encounter: Payer: Self-pay | Admitting: Neurology

## 2019-01-11 ENCOUNTER — Other Ambulatory Visit: Payer: Self-pay

## 2019-01-11 ENCOUNTER — Ambulatory Visit (INDEPENDENT_AMBULATORY_CARE_PROVIDER_SITE_OTHER): Payer: Medicare Other | Admitting: Neurology

## 2019-01-11 VITALS — BP 110/78 | HR 72 | Temp 97.8°F | Ht 68.0 in | Wt 140.0 lb

## 2019-01-11 DIAGNOSIS — G35 Multiple sclerosis: Secondary | ICD-10-CM | POA: Diagnosis not present

## 2019-01-11 DIAGNOSIS — F09 Unspecified mental disorder due to known physiological condition: Secondary | ICD-10-CM

## 2019-01-11 DIAGNOSIS — R413 Other amnesia: Secondary | ICD-10-CM

## 2019-01-11 DIAGNOSIS — R5383 Other fatigue: Secondary | ICD-10-CM | POA: Diagnosis not present

## 2019-01-11 DIAGNOSIS — Z79899 Other long term (current) drug therapy: Secondary | ICD-10-CM

## 2019-01-11 DIAGNOSIS — R260 Ataxic gait: Secondary | ICD-10-CM

## 2019-01-11 DIAGNOSIS — F418 Other specified anxiety disorders: Secondary | ICD-10-CM

## 2019-01-11 MED ORDER — SERTRALINE HCL 50 MG PO TABS: 50.0000 mg | ORAL_TABLET | Freq: Every day | ORAL | 11 refills | Status: DC
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
## 2019-01-12 LAB — CBC WITH DIFFERENTIAL/PLATELET
Basophils Absolute: 0.1 10*3/uL (ref 0.0–0.2)
Basos: 1 %
EOS (ABSOLUTE): 0.5 10*3/uL — ABNORMAL HIGH (ref 0.0–0.4)
Eos: 6 %
Hematocrit: 39.3 % (ref 37.5–51.0)
Hemoglobin: 13.2 g/dL (ref 13.0–17.7)
Immature Grans (Abs): 0 10*3/uL (ref 0.0–0.1)
Immature Granulocytes: 0 %
Lymphocytes Absolute: 1.8 10*3/uL (ref 0.7–3.1)
Lymphs: 21 %
MCH: 32.4 pg (ref 26.6–33.0)
MCHC: 33.6 g/dL (ref 31.5–35.7)
MCV: 96 fL (ref 79–97)
Monocytes Absolute: 0.6 10*3/uL (ref 0.1–0.9)
Monocytes: 7 %
Neutrophils Absolute: 5.6 10*3/uL (ref 1.4–7.0)
Neutrophils: 65 %
Platelets: 309 10*3/uL (ref 150–450)
RBC: 4.08 x10E6/uL — ABNORMAL LOW (ref 4.14–5.80)
RDW: 11.9 % (ref 11.6–15.4)
WBC: 8.6 10*3/uL (ref 3.4–10.8)

## 2019-01-12 LAB — HEPATIC FUNCTION PANEL
ALT: 9 IU/L (ref 0–44)
AST: 16 IU/L (ref 0–40)
Albumin: 4.7 g/dL (ref 3.8–4.9)
Alkaline Phosphatase: 71 IU/L (ref 39–117)
Bilirubin Total: 0.6 mg/dL (ref 0.0–1.2)
Bilirubin, Direct: 0.17 mg/dL (ref 0.00–0.40)
Total Protein: 7 g/dL (ref 6.0–8.5)

## 2019-01-12 LAB — HEPATITIS B SURFACE ANTIBODY,QUALITATIVE: Hep B Surface Ab, Qual: NONREACTIVE

## 2019-01-12 LAB — HEPATITIS B SURFACE ANTIGEN: Hepatitis B Surface Ag: NEGATIVE

## 2019-01-12 LAB — HEPATITIS B CORE ANTIBODY, TOTAL: Hep B Core Total Ab: NEGATIVE

## 2019-01-12 LAB — HEPATITIS C ANTIBODY: Hep C Virus Ab: <0.1 s/co ratio (ref 0.0–0.9)

## 2019-01-15 ENCOUNTER — Telehealth: Payer: Self-pay | Admitting: Neurology

## 2019-01-15 ENCOUNTER — Telehealth: Payer: Self-pay | Admitting: *Deleted

## 2019-01-15 NOTE — Telephone Encounter (Signed)
Faxed completed/signed Ocrevus start form to [facility] at [phone]. Received fax confirmation.  Gave completed start form/orders to [facility] to start processing for pt. Also sent copy to be scanned into epic.

## 2019-01-15 NOTE — Telephone Encounter (Signed)
Medicare order sent to [facility]. No auth they will reach out to the patient to schedule.  

## 2019-02-07 ENCOUNTER — Other Ambulatory Visit: Payer: Medicare Other

## 2019-02-10 ENCOUNTER — Inpatient Hospital Stay: Admission: RE | Admit: 2019-02-10 | Payer: Medicare Other | Source: Ambulatory Visit

## 2019-03-01 ENCOUNTER — Other Ambulatory Visit: Payer: Medicare Other

## 2019-10-08 ENCOUNTER — Ambulatory Visit: Payer: Medicare Other | Admitting: Family Medicine

## 2019-10-08 NOTE — Progress Notes (Deleted)
PATIENT: Zachary Rasmussen DOB: Mar 11, 1966  REASON FOR VISIT: follow up HISTORY FROM: patient  No chief complaint on file.    HISTORY OF PRESENT ILLNESS: Today 10/08/19 Zachary Rasmussen is a 54 y.o. male here today for follow up of MS.   He was admitted for voluntary detox in 04/2019. 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 Detox program successfully completed and he was discharged with outpatient psychiatry follow up.   HISTORY: (copied from Dr Bonnita Hollow note on 01/11/2019)  He feels he has declined since the last visit.Marland Kitchen    His last Ocrevus was 07/2017 and he did not follow up for next one.     His gait is doing worse and he has some falls over the last year.  Balance is poor.Marland Kitchen    His speech is slurred.    He notes mild weakness in his legs.   Bladder function is about the same.     He notes fatigue on a daily basis.  Additionally, he has more issues with focus and attention.  Short term memory is doing worse.     He notes fatigue.    He is having depression and anxiety.  Buspar had not helped in the past.   He does not recall being on an SSRI anytime recently.  We discussed disease modifying therapy options.  He felt he was doing okay on the Ocrevus and would like to get back on that medication.   REVIEW OF SYSTEMS: Out of a complete 14 system review of symptoms, the patient complains only of the following symptoms, and all other reviewed systems are negative.  ALLERGIES: Allergies  Allergen Reactions  . Penicillins Rash    HOME MEDICATIONS: Outpatient Medications Prior to Visit  Medication Sig Dispense Refill  . ocrelizumab (OCREVUS) 300 MG/10ML injection Inject into the vein once.    . sertraline (ZOLOFT) 50 MG tablet Take 1 tablet (50 mg total) by mouth daily. 30 tablet 11   No facility-administered medications prior to visit.    PAST MEDICAL HISTORY: Past Medical History:  Diagnosis Date  . Fatigue 06/24/2014  . Multiple  sclerosis (HCC) 06/24/2014    PAST SURGICAL HISTORY: Past Surgical History:  Procedure Laterality Date  . BACK SURGERY     x2    FAMILY HISTORY: Family History  Problem Relation Age of Onset  . Rheum arthritis Father   . Hyperlipidemia Father   . Hypertension Father   . Asthma Son   . Cancer Paternal Grandfather   . Heart failure Son   . Hypertension Mother   . Thyroid disease Neg Hx   . Stroke Neg Hx   . Seizures Neg Hx   . Rashes / Skin problems Neg Hx   . Migraines Neg Hx   . Diabetes Neg Hx     SOCIAL HISTORY: Social History   Socioeconomic History  . Marital status: Single    Spouse name: Not on file  . Number of children: Not on file  . Years of education: Not on file  . Highest education level: Not on file  Occupational History  . Not on file  Tobacco Use  . Smoking status: Current Every Day Smoker    Packs/day: 1.00    Years: 35.00    Pack years: 35.00    Types: Cigarettes  . Smokeless tobacco: Never Used  Substance and Sexual Activity  . Alcohol use: Not Currently    Alcohol/week: 0.0 standard drinks  . Drug  use: No  . Sexual activity: Not on file  Other Topics Concern  . Not on file  Social History Narrative   Caffeine use: 1-2 sodas/tea per day   Lives with fiance   Right handed    Social Determinants of Health   Financial Resource Strain:   . Difficulty of Paying Living Expenses:   Food Insecurity:   . Worried About Charity fundraiser in the Last Year:   . Arboriculturist in the Last Year:   Transportation Needs:   . Film/video editor (Medical):   Marland Kitchen Lack of Transportation (Non-Medical):   Physical Activity:   . Days of Exercise per Week:   . Minutes of Exercise per Session:   Stress:   . Feeling of Stress :   Social Connections:   . Frequency of Communication with Friends and Family:   . Frequency of Social Gatherings with Friends and Family:   . Attends Religious Services:   . Active Member of Clubs or Organizations:   .  Attends Archivist Meetings:   Marland Kitchen Marital Status:   Intimate Partner Violence:   . Fear of Current or Ex-Partner:   . Emotionally Abused:   Marland Kitchen Physically Abused:   . Sexually Abused:       PHYSICAL EXAM  There were no vitals filed for this visit. There is no height or weight on file to calculate BMI.  Generalized: Well developed, in no acute distress  Cardiology: normal rate and rhythm, no murmur noted Respiratory: clear to auscultation bilaterally  Neurological examination  Mentation: Alert oriented to time, place, history taking. Follows all commands speech and language fluent Cranial nerve II-XII: Pupils were equal round reactive to light. Extraocular movements were full, visual field were full on confrontational test. Facial sensation and strength were normal. Uvula tongue midline. Head turning and shoulder shrug  were normal and symmetric. Motor: The motor testing reveals 5 over 5 strength of all 4 extremities. Good symmetric motor tone is noted throughout.  Sensory: Sensory testing is intact to soft touch on all 4 extremities. No evidence of extinction is noted.  Coordination: Cerebellar testing reveals good finger-nose-finger and heel-to-shin bilaterally.  Gait and station: Gait is normal. Tandem gait is normal. Romberg is negative. No drift is seen.  Reflexes: Deep tendon reflexes are symmetric and normal bilaterally.   DIAGNOSTIC DATA (LABS, IMAGING, TESTING) - I reviewed patient records, labs, notes, testing and imaging myself where available.  No flowsheet data found.   Lab Results  Component Value Date   WBC 8.6 01/11/2019   HGB 13.2 01/11/2019   HCT 39.3 01/11/2019   MCV 96 01/11/2019   PLT 309 01/11/2019      Component Value Date/Time   PROT 7.0 01/11/2019 1803   ALBUMIN 4.7 01/11/2019 1803   AST 16 01/11/2019 1803   ALT 9 01/11/2019 1803   ALKPHOS 71 01/11/2019 1803   BILITOT 0.6 01/11/2019 1803   No results found for: CHOL, HDL, LDLCALC,  LDLDIRECT, TRIG, CHOLHDL No results found for: HGBA1C No results found for: VITAMINB12 No results found for: TSH     ASSESSMENT AND PLAN 54 y.o. year old male  has a past medical history of Fatigue (06/24/2014) and Multiple sclerosis ([place]) (06/24/2014). here with ***    ICD-10-CM   1. Multiple sclerosis ([place])  G35        No orders of the defined types were placed in this encounter.    No orders of the defined  types were placed in this encounter.     I spent 15 minutes with the patient. 50% of this time was spent counseling and educating patient on plan of care and medications.    Shawnie Dapper, FNP-C 10/08/2019, 12:38 PM [facility] [address] [place], [place] [address] [phone]

## 2019-11-20 ENCOUNTER — Ambulatory Visit: Payer: Medicare PPO | Admitting: Family Medicine

## 2019-11-21 ENCOUNTER — Ambulatory Visit: Payer: Medicare PPO | Admitting: Family Medicine

## 2020-07-18 ENCOUNTER — Other Ambulatory Visit: Payer: Self-pay

## 2020-07-18 ENCOUNTER — Ambulatory Visit: Payer: Medicare PPO | Admitting: Neurology

## 2020-07-18 ENCOUNTER — Encounter: Payer: Self-pay | Admitting: Neurology

## 2020-07-18 VITALS — Ht 68.0 in | Wt 165.5 lb

## 2020-07-18 DIAGNOSIS — F09 Unspecified mental disorder due to known physiological condition: Secondary | ICD-10-CM

## 2020-07-18 DIAGNOSIS — R260 Ataxic gait: Secondary | ICD-10-CM | POA: Diagnosis not present

## 2020-07-18 DIAGNOSIS — G35 Multiple sclerosis: Secondary | ICD-10-CM

## 2020-07-18 DIAGNOSIS — R5383 Other fatigue: Secondary | ICD-10-CM | POA: Diagnosis not present

## 2020-07-18 DIAGNOSIS — E559 Vitamin D deficiency, unspecified: Secondary | ICD-10-CM | POA: Diagnosis not present

## 2020-07-18 DIAGNOSIS — F418 Other specified anxiety disorders: Secondary | ICD-10-CM

## 2020-07-18 MED ORDER — SERTRALINE HCL 100 MG PO TABS: 50.0000 mg | ORAL_TABLET | Freq: Every day | ORAL | 3 refills | Status: DC

## 2020-07-18 MED ORDER — MODAFINIL 200 MG PO TABS: 200.0000 mg | ORAL_TABLET | Freq: Every day | ORAL | 5 refills | Status: DC

## 2020-07-18 NOTE — Progress Notes (Signed)
[facility]  PATIENT: Zachary Rasmussen DOB: [birth year]  REFERRING CLINICIAN: No primary care physician HISTORY FROM: Patient REASON FOR VISIT: Multiple sclerosis and worsening symptoms    HISTORICAL  CHIEF COMPLAINT:  Chief Complaint  Patient presents with  . Follow-up    "Just needed to see the doctor, no new problems or concerns" Room 13, wife Boyd Kerbs in room    HISTORY OF PRESENT ILLNESS:  Zachary Rasmussen is a 55 yo man with multiple sclerosis.      Update 07/18/2020: He feels mostly stable.   He stopped Ocrevus 2-3 years ago after an infusion reaction.Marland Kitchen    His last Ocrevus was 07/2017.   He had about 1-2 weeks of hallucinations about a year ago but he did not go to the ED.  There were 2 deaths inhis family 2 weeks ago.      In 09/2017, he needed Hemodialysis due to ARF and hyperkalemia (etiology of ARF not clear)  His gait is off balanced and he has had a few falls, none recently.  Sometimes he has a right foot drop.  Strength is the same with mild right > left  leg weakness     His speech is slurred.   He has some numbness in his hands and feet   Bladder function is about the same.     He has fatigue.  Ritalin had helped the most.  Additionally, he has more issues with focus and attention.  Short term memory is doing worse.  He has trouble with time.    He is having anxiety > depression .    He is on sertraline 50 mg with mild benefit.  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  We discussed disease modifying therapy options.  He prefers not to restart Ocrevus as he had reactions but would like to go back on Plegridy   MS History:    He presented initially with optic neuritis on the right. He got much better but continues to have some blurry vision out of the right eye. About the same time, he also began to notice that his gait was a little off balance. Although the vision has pretty much  been stable, the gait has worsened over the years. He also has had worsening difficulties with attention and with verbal fluency and with memory.    He also notes numbness in the left face with tingling.    He reports difficulty with fatigue and gets benefit from IV steroid.   However, over the last year, the benefit has been less and there was concern for bone loss.   .   He got more benefit from Ritalin .  He did misuse it (taking higher doses in the past and then running out early) and it was discontinued a few years ago.   He was initially placed on Betaseron and switched to Plegridy to have fewer number of injections. While on these medicines he has not had any clearcut exacerbations though he has had some progression of his symptoms.   He tried to quit her but stopped it due to tolerability issues.   He switched to Ocrevus in late 2017 but stopped in 2020 due to infusion reactions.     REVIEW OF SYSTEMS:  Constitutional: No fevers, chills, sweats, or change in appetite.  Has fatigue Eyes: No new visual changes, double vision, eye pain Ear, nose and throat:  No hearing loss, ear pain, nasal congestion, sore throat Cardiovascular: No chest pain, palpitations Respiratory:  No shortness of breath at rest or with exertion.   No wheezes GastrointestinaI: No nausea, vomiting, diarrhea, abdominal pain, fecal incontinence Genitourinary:  See above    No nocturia. Musculoskeletal:    He continues to have back pain even after surgery.  He has right hip pain Integumentary: No rash, pruritus, skin lesions Neurological: as above Psychiatric: see above. Endocrine: No palpitations, diaphoresis, change in appetite, change in weigh or increased thirst Hematologic/Lymphatic:  No anemia, purpura, petechiae. Allergic/Immunologic: No itchy/runny eyes, nasal congestion, recent allergic reactions, rashes  ALLERGIES: Allergies  Allergen Reactions  . Penicillins Rash    HOME MEDICATIONS: See Med List  PAST  MEDICAL HISTORY: Past Medical History:  Diagnosis Date  . Fatigue 06/24/2014  . Multiple sclerosis (HCC) 06/24/2014    PAST SURGICAL HISTORY: Past Surgical History:  Procedure Laterality Date  . BACK SURGERY     x2    FAMILY HISTORY: Family History  Problem Relation Age of Onset  . Rheum arthritis Father   . Hyperlipidemia Father   . Hypertension Father   . Asthma Son   . Cancer Paternal Grandfather   . Heart failure Son   . Hypertension Mother   . Thyroid disease Neg Hx   . Stroke Neg Hx   . Seizures Neg Hx   . Rashes / Skin problems Neg Hx   . Migraines Neg Hx   . Diabetes Neg Hx     SOCIAL HISTORY:  Social History   Socioeconomic History  . Marital status: Married    Spouse name: Boyd Kerbs  . Number of children: Not on file  . Years of education: Not on file  . Highest education level: Not on file  Occupational History  . Occupation: unemployed  Tobacco Use  . Smoking status: Current Every Day Smoker    Packs/day: 0.50    Years: 35.00    Pack years: 17.50    Types: Cigarettes  . Smokeless tobacco: Never Used  Substance and Sexual Activity  . Alcohol use: Not Currently    Alcohol/week: 0.0 standard drinks  . Drug use: No  . Sexual activity: Not on file  Other Topics Concern  . Not on file  Social History Narrative   Caffeine use: 1-2 sodas/tea per day   Lives with wife ad ddaughter   Right handed    Social Determinants of Health   Financial Resource Strain: Not on file  Food Insecurity: Not on file  Transportation Needs: Not on file  Physical Activity: Not on file  Stress: Not on file  Social Connections: Not on file  Intimate Partner Violence: Not on file     PHYSICAL EXAM  Vitals:   07/18/20 1039  Weight: 165 lb 8 oz (75.1 kg)  Height: 5\' 8"  (1.727 m)    Body mass index is 25.16 kg/m.   General: The patient is well-developed and well-nourished and in no acute distress.        Neurologic Exam  Mental status: The patient is alert  and oriented x 3 at the time of the examination. The patient has reduced attention span and concentration ability.   Speech is normal.  Cranial nerves: Extraocular movements are full. Strength is normal. There is reduced facial sensation on the left..  Trapezius and sternocleidomastoid strength is normal.  Voice is slurred   No obvious hearing deficits are noted.  Motor:  Muscle bulk is normal. Tone is  mildly increased in the legs. Strength is  5 / 5 in all 4 extremities.   Sensory: Sensory testing is intact to touch in the arms and legs.   Coordination: Cerebellar testing reveals good finger-nose-finger and  reduced heel-to-shin right worse than left.  Gait and station: Station is stable.  The gait is wide with mild right foot drop. The tandem gait is poor.   Romberg is negative.  Reflexes: Deep tendon reflexes are increased at knees with spread, left greater than right. There is no ankle clonus..                                                                          1. Multiple sclerosis (HCC)   2. Vitamin D deficiency   3. Ataxic gait   4. Other fatigue   5. Cognitive dysfunction   6. Depression with anxiety     1.   We discussed an interferon such as Plegridy.  I will check an MRI of the brain to determine the extent of subclinical progression since his previous MRI.  We will check blood work .   2.   He asked about Ritalin for his fatigue as it has helped in the past.   He had an issue with misue in the past and is currently on Suboxone.  Therefore we will avoid.  I can have him try modafinil.  . 3.   Stay active.   Exercise as tolerated 4.    rtc 6t months, sooner if problems.      Juniel Groene A. Epimenio Foot, MD, PhD 07/18/2020, 11:32 AM Certified in Neurology, Clinical Neurophysiology, Sleep Medicine, Pain Medicine and Neuroimaging  [facility] [address], [address] [place], [place] [address] [phone]

## 2020-07-19 LAB — COMPREHENSIVE METABOLIC PANEL
ALT: 9 IU/L (ref 0–44)
AST: 19 IU/L (ref 0–40)
Albumin/Globulin Ratio: 2 (ref 1.2–2.2)
Albumin: 4.5 g/dL (ref 3.8–4.9)
Alkaline Phosphatase: 67 IU/L (ref 44–121)
BUN/Creatinine Ratio: 14 (ref 9–20)
BUN: 12 mg/dL (ref 6–24)
Bilirubin Total: 0.5 mg/dL (ref 0.0–1.2)
CO2: 21 mmol/L (ref 20–29)
Calcium: 9.1 mg/dL (ref 8.7–10.2)
Chloride: 105 mmol/L (ref 96–106)
Creatinine, Ser: 0.88 mg/dL (ref 0.76–1.27)
GFR calc Af Amer: 112 mL/min/{1.73_m2} (ref >59)
GFR calc non Af Amer: 97 mL/min/{1.73_m2} (ref >59)
Globulin, Total: 2.3 g/dL (ref 1.5–4.5)
Glucose: 85 mg/dL (ref 65–99)
Potassium: 4.5 mmol/L (ref 3.5–5.2)
Sodium: 141 mmol/L (ref 134–144)
Total Protein: 6.8 g/dL (ref 6.0–8.5)

## 2020-07-19 LAB — CBC WITH DIFFERENTIAL/PLATELET
Basophils Absolute: 0.1 10*3/uL (ref 0.0–0.2)
Basos: 1 %
EOS (ABSOLUTE): 0.4 10*3/uL (ref 0.0–0.4)
Eos: 5 %
Hematocrit: 40.7 % (ref 37.5–51.0)
Hemoglobin: 13.5 g/dL (ref 13.0–17.7)
Immature Grans (Abs): 0 10*3/uL (ref 0.0–0.1)
Immature Granulocytes: 0 %
Lymphocytes Absolute: 1.5 10*3/uL (ref 0.7–3.1)
Lymphs: 18 %
MCH: 30.5 pg (ref 26.6–33.0)
MCHC: 33.2 g/dL (ref 31.5–35.7)
MCV: 92 fL (ref 79–97)
Monocytes Absolute: 0.6 10*3/uL (ref 0.1–0.9)
Monocytes: 7 %
Neutrophils Absolute: 5.7 10*3/uL (ref 1.4–7.0)
Neutrophils: 69 %
Platelets: 302 10*3/uL (ref 150–450)
RBC: 4.42 x10E6/uL (ref 4.14–5.80)
RDW: 12.3 % (ref 11.6–15.4)
WBC: 8.4 10*3/uL (ref 3.4–10.8)

## 2020-07-19 LAB — VITAMIN D 25 HYDROXY (VIT D DEFICIENCY, FRACTURES): Vit D, 25-Hydroxy: 12.9 ng/mL — ABNORMAL LOW (ref 30.0–100.0)

## 2020-07-21 ENCOUNTER — Telehealth: Payer: Self-pay | Admitting: *Deleted

## 2020-07-21 DIAGNOSIS — R7989 Other specified abnormal findings of blood chemistry: Secondary | ICD-10-CM

## 2020-07-21 NOTE — Telephone Encounter (Signed)
Initiated PA modafinil on CMM. [id number]. Got the following message back:  A Communication error has occurred, please retry later. If this issue re-occurs, please contact your administrator's support team or contact [employer] at [phone] for further assistance.  I tried renewing request. [id number] - PA Case ID: [id number]. Was able to get to clinical questions to answer with this one. Waiting on clinical notes to be attached and then will submit for review.

## 2020-07-21 NOTE — Telephone Encounter (Signed)
LVM for pt to call about results. °

## 2020-07-21 NOTE — Telephone Encounter (Signed)
Submitted PA, waiting on determination. 

## 2020-07-21 NOTE — Telephone Encounter (Signed)
PA Case: [id number], Status: Approved, Coverage Starts on: 06/14/2020 12:00:00 AM, Coverage Ends on: 06/13/2021 12:00:00 AM. Questions? Contact [phone].

## 2020-07-21 NOTE — Telephone Encounter (Signed)
-----   Message from Asa Lente, MD sent at 07/19/2020 12:17 PM EST ----- Vit d was very low   lets do 50000 weekly   #13 #3

## 2020-07-22 ENCOUNTER — Telehealth: Payer: Self-pay | Admitting: Neurology

## 2020-07-22 ENCOUNTER — Telehealth: Payer: Self-pay

## 2020-07-22 MED ORDER — VITAMIN D (ERGOCALCIFEROL) 1.25 MG (50000 UNIT) PO CAPS: 50000.0000 [IU] | ORAL_CAPSULE | ORAL | 3 refills | Status: DC

## 2020-07-22 NOTE — Telephone Encounter (Signed)
Completed PA for Plegridy via CMM. Key: Key: [id number]. sent to [facility] [employer].  Should have a determination within 3-5 business days.

## 2020-07-22 NOTE — Telephone Encounter (Signed)
[employer] KPQA:[id number] (exp. 07/22/20 to 08/21/20) order sent to GI. They will reach out to the patient to schedule.

## 2020-07-22 NOTE — Telephone Encounter (Signed)
I spoke with Dr. Epimenio Foot.  Patient should start Plegridy.  If Plegridy cannot be approved he may start Avonex.  I have completed Plegridy start form and given to Dr. Epimenio Foot for review and signature.  I will keep Avonex start form for now.

## 2020-07-22 NOTE — Telephone Encounter (Signed)
Faxed Plegridy start form to [employer].  Received a receipt of confirmation.

## 2020-07-22 NOTE — Telephone Encounter (Signed)
Called and spoke with pt about lab results per Dr. Epimenio Foot note. He verbalized understanding. I e-scribed Vit D to [facility] per pt request. Pt asked for update on whether injection was approved for MS. Advised I did not get message from Dr. Epimenio Foot on this. I will speak with MD and call him back.

## 2020-07-24 ENCOUNTER — Ambulatory Visit
Admission: RE | Admit: 2020-07-24 | Discharge: 2020-07-24 | Disposition: A | Discharge status: Home / Self Care | Payer: Medicare Other | Source: Ambulatory Visit | Attending: Neurology | Admitting: Neurology

## 2020-07-24 ENCOUNTER — Other Ambulatory Visit: Payer: Self-pay

## 2020-07-24 DIAGNOSIS — G35 Multiple sclerosis: Secondary | ICD-10-CM

## 2020-07-24 MED ORDER — GADOBENATE DIMEGLUMINE 529 MG/ML IV SOLN
15.0000 mL | Freq: Once | INTRAVENOUS | Status: AC | PRN
  Administered 2020-07-24: 15 mL via INTRAVENOUS

## 2020-07-24 NOTE — Telephone Encounter (Signed)
Faxed completed/signefd Avonex start form [employer] at [phone]. Received fax confirmation.

## 2020-07-24 NOTE — Addendum Note (Signed)
Addended by: Arther Abbott on: 07/24/2020 03:37 PM   Modules accepted: Orders

## 2020-07-24 NOTE — Telephone Encounter (Signed)
Checked on status of PA for Plegridy on CMM. PA denied stating:  "The drug you asked for is non-formulary (not on [employer] list of preferred drugs). Before the drug can be covered, we need more information. Please ask your prescriber to explain to [facility] why the preferred drugs have not worked for your medical condition and/or would have bad side effects. If you have not tried the preferred drugs, including dimethyl fumarate capsule delayed release, please talk to your health care provider about prescribing one of these for you. Some preferred drugs may require an additional review and approval by [facility]. Additionally, some alternative drugs listed may be the same drugs with different strengths or forms. Under certain cases, [employer] may only require trial and failure of one strength or form of that drug. This determination was based on the [facility] Non-Formulary Exceptions Coverage Policy."  I called pt. Verified he has [employer]Medicare insurance coverage. Made him aware insurance denied Plegridy. We will see if they will approve coverage for Avonex. We are going to send in start form and work on PA via insurance. I will call back if I need anything else throughout the process. He verbalized understanding. He completed MRI this am. Advised once Dr. Epimenio Foot has results, we will call him.

## 2020-07-25 ENCOUNTER — Telehealth: Payer: Self-pay | Admitting: Neurology

## 2020-07-25 DIAGNOSIS — I639 Cerebral infarction, unspecified: Secondary | ICD-10-CM

## 2020-07-25 NOTE — Telephone Encounter (Signed)
I spoke to Zachary Rasmussen about the MRI results.  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  The stroke likely happened in April 2019 when he was found unresponsive.  He had a CT scan that day that was normal and we followed up about 6 weeks later May 2019 with an MRI that showed the stroke.  We had ordered carotid ultrasound and echocardiogram but he had not done those procedures.  I will reorder the carotid ultrasound and echocardiogram to make sure that there are not additional risk factors for stroke.  He does note that he has had decreased visual processing since April/May 2019.  He did not have any numbness or weakness.

## 2020-07-28 NOTE — Telephone Encounter (Signed)
Received fax from [facility] that PA was approved for 30 days supply, not 90 via [employer] until 06/13/21. Sent notice to [employer]/Crystal P. About approval.

## 2020-07-28 NOTE — Telephone Encounter (Signed)
Submitted PA Avonex on CMM. [id number]. Waiting on determination from [facility]. Pt ID: [id number]

## 2020-08-04 NOTE — Telephone Encounter (Signed)
I called patient.  His Avonex injections should arrive tomorrow.  I will call him next week to see how the Avonex is going.  Patient verbalized understanding.

## 2020-08-06 ENCOUNTER — Ambulatory Visit: Payer: Medicare PPO | Admitting: Neurology

## 2020-08-06 NOTE — Telephone Encounter (Signed)
Received fax from [employer] that pt injected his first dose of Avonex on 08/06/20.

## 2020-08-07 ENCOUNTER — Other Ambulatory Visit: Payer: Medicare Other

## 2020-08-11 NOTE — Telephone Encounter (Signed)
I called patient.  He reports that his first dose of Avonex on August 06, 2020 was tolerated well.  He has no questions or concerns.  He will follow-up as scheduled in August with Dr. Epimenio Foot.

## 2020-08-18 ENCOUNTER — Telehealth: Payer: Self-pay | Admitting: Neurology

## 2020-08-18 ENCOUNTER — Other Ambulatory Visit: Payer: Self-pay | Admitting: Neurology

## 2020-08-18 NOTE — Telephone Encounter (Signed)
Pt called, at my last office visit the physician discuss scheduling me a vascular scan.  I do not know the name of it.Someone was suppose to call me,  I have not heard from anyone to schedule an appt. Would like a call from the nurse.

## 2020-08-18 NOTE — Telephone Encounter (Signed)
Zachary Rasmussen, can you follow up on this? Dr. Epimenio Foot ordered US carotid bilateral nad Echo on 07/25/20

## 2020-08-19 ENCOUNTER — Other Ambulatory Visit: Payer: Self-pay | Admitting: *Deleted

## 2020-08-19 DIAGNOSIS — I639 Cerebral infarction, unspecified: Secondary | ICD-10-CM

## 2020-08-19 DIAGNOSIS — F09 Unspecified mental disorder due to known physiological condition: Secondary | ICD-10-CM

## 2020-08-19 DIAGNOSIS — R260 Ataxic gait: Secondary | ICD-10-CM

## 2020-08-19 NOTE — Telephone Encounter (Signed)
Ander Gaster Noted I have made [facility] aware [phone] . Can you re- order Carotid for Dr. Epimenio Foot please [id number] . Thanks Annabelle Harman

## 2020-08-19 NOTE — Telephone Encounter (Signed)
Dr. Epimenio Foot- see note from [facility]

## 2020-08-19 NOTE — Telephone Encounter (Signed)
Update [name] from [place] .   [11:12 AM] Zachary Rasmussen This man just named every COVID symptom out there as to why he wanted his test done asap.  I suggested he contact his primary care asap and just to be safe get tested.  I schedule him at [facility] in April.    He was coughing his head off. He said he already had an appointment with his primary, denied being short of breath or trouble breathing.

## 2020-08-19 NOTE — Telephone Encounter (Signed)
Zachary Rasmussen- I have placed that order, thank you

## 2020-08-19 NOTE — Telephone Encounter (Signed)
Noted Thanks Dana  °

## 2020-09-17 ENCOUNTER — Ambulatory Visit (HOSPITAL_BASED_OUTPATIENT_CLINIC_OR_DEPARTMENT_OTHER): Payer: Medicare PPO

## 2020-09-18 ENCOUNTER — Telehealth: Payer: Self-pay | Admitting: Neurology

## 2020-09-18 NOTE — Telephone Encounter (Signed)
Called patient back. He started Avonex 08/06/20. He started having flu-like sx/lower temp (96/97)/skin feels sweaty for about 3-4 days after injection.  He has tried taking tylenol/ibuprofen pre and post injection but it has been ineffective. Last injection a week ago and he is due now for another. He is worried about taking another one d/t SE. Advised Dr. Epimenio Foot out of office. I will discuss w/ Dr. Terrace Arabia and call back to advise on how to proceed.

## 2020-09-18 NOTE — Telephone Encounter (Signed)
Pt called, have a reaction to Interferon Beta-1a (AVONEX PEN IM). Having body temperature below normal, flu like symptoms, skin feel sweaty. Would like a call from the nurse.

## 2020-09-18 NOTE — Telephone Encounter (Signed)
Called patient back. Relayed Dr. Zannie Cove recommendation. He is agreeable to try this. He will call back Monday if he still does not tolerate ok.

## 2020-09-18 NOTE — Telephone Encounter (Signed)
Spoke with Dr. Terrace Arabia. She recommends pt premedicate again with tylenol/ibuprofen. He could take it Friday night, prior to bed and go to sleep. Hopefully this will help. He should then take tylenol/ibuprofen as needed after injection over the weekend. If still not tolerated, he should call back and we will see what Dr. Bonnita Hollow recommends for him at that point.

## 2020-09-22 MED ORDER — PREDNISONE 20 MG PO TABS: ORAL_TABLET | ORAL | 4 refills | Status: DC

## 2020-09-22 NOTE — Telephone Encounter (Signed)
Pt called, still having issues, medication making me feel funny. Do not want to take the medication until I speak with someone. Would like a call from the nurse.

## 2020-09-22 NOTE — Telephone Encounter (Addendum)
Spoke with Dr. Epimenio Foot. He would like patient to take prednisone 20mg  tablet morning of Avonex injection. He will then premedicate with Tylenol and take injection in the evening.   Can call in prednisone 20mg  #12 (90days supply) w/ 4 refills.  I called pt and relayed Dr. recommendation. He is agreeable to try this. I e-scribed rx. He will call back if he continues to have issues.

## 2020-09-22 NOTE — Addendum Note (Signed)
Addended byYetta Barre, Lorianna Spadaccini L on: 09/22/2020 04:00 PM   Modules accepted: Orders

## 2020-10-13 NOTE — Telephone Encounter (Addendum)
Spoke with Dr. Epimenio Foot. Would like to fit him in for appt in the next couple weeks to discuss other options. In the meantime, he would like him to continue med until seen in office  Called pt. Relayed message. Scheduled f/u for 10/27/20 at 3pm w/ Dr. Epimenio Foot. He is aware of risk for relapse if he stops medication. He wants to discuss further options at appt. He feels too bad on Avnoex for 4-5 days after he takes it. Prednisone/tylenol/ibuprofen did not help w/ SE of Avonex.

## 2020-10-13 NOTE — Telephone Encounter (Signed)
Pt called, having trouble taking Interferon Beta-1a (AVONEX PEN IM). Would like a call from the nurse.

## 2020-10-22 ENCOUNTER — Ambulatory Visit (HOSPITAL_BASED_OUTPATIENT_CLINIC_OR_DEPARTMENT_OTHER)
Admission: RE | Admit: 2020-10-22 | Discharge: 2020-10-22 | Disposition: A | Discharge status: Home / Self Care | Payer: Medicare PPO | Source: Ambulatory Visit | Attending: Neurology | Admitting: Neurology

## 2020-10-22 ENCOUNTER — Other Ambulatory Visit: Payer: Self-pay

## 2020-10-22 DIAGNOSIS — F17200 Nicotine dependence, unspecified, uncomplicated: Secondary | ICD-10-CM | POA: Diagnosis not present

## 2020-10-22 DIAGNOSIS — I639 Cerebral infarction, unspecified: Secondary | ICD-10-CM

## 2020-10-22 DIAGNOSIS — F09 Unspecified mental disorder due to known physiological condition: Secondary | ICD-10-CM

## 2020-10-22 DIAGNOSIS — I340 Nonrheumatic mitral (valve) insufficiency: Secondary | ICD-10-CM | POA: Diagnosis not present

## 2020-10-22 DIAGNOSIS — R260 Ataxic gait: Secondary | ICD-10-CM

## 2020-10-22 DIAGNOSIS — I6389 Other cerebral infarction: Secondary | ICD-10-CM

## 2020-10-22 DIAGNOSIS — G35 Multiple sclerosis: Secondary | ICD-10-CM | POA: Insufficient documentation

## 2020-10-23 LAB — ECHOCARDIOGRAM COMPLETE
AR max vel: 2.4 cm2
AV Area VTI: 2.62 cm2
AV Area mean vel: 2.33 cm2
AV Mean grad: 4 mmHg
AV Peak grad: 7.4 mmHg
Ao pk vel: 1.36 m/s
Area-P 1/2: 4.06 cm2
Calc EF: 62.9 %
S' Lateral: 2.81 cm
Single Plane A2C EF: 60.7 %
Single Plane A4C EF: 67.2 %

## 2020-10-27 ENCOUNTER — Encounter: Payer: Self-pay | Admitting: Neurology

## 2020-10-27 ENCOUNTER — Ambulatory Visit (INDEPENDENT_AMBULATORY_CARE_PROVIDER_SITE_OTHER): Payer: Medicare PPO | Admitting: Neurology

## 2020-10-27 ENCOUNTER — Telehealth: Payer: Self-pay | Admitting: *Deleted

## 2020-10-27 VITALS — BP 156/90 | HR 71 | Ht 68.0 in | Wt 162.5 lb

## 2020-10-27 DIAGNOSIS — Z79899 Other long term (current) drug therapy: Secondary | ICD-10-CM

## 2020-10-27 DIAGNOSIS — G35 Multiple sclerosis: Secondary | ICD-10-CM

## 2020-10-27 DIAGNOSIS — F09 Unspecified mental disorder due to known physiological condition: Secondary | ICD-10-CM

## 2020-10-27 DIAGNOSIS — I639 Cerebral infarction, unspecified: Secondary | ICD-10-CM

## 2020-10-27 DIAGNOSIS — R5383 Other fatigue: Secondary | ICD-10-CM

## 2020-10-27 DIAGNOSIS — I6521 Occlusion and stenosis of right carotid artery: Secondary | ICD-10-CM

## 2020-10-27 DIAGNOSIS — I63031 Cerebral infarction due to thrombosis of right carotid artery: Secondary | ICD-10-CM | POA: Insufficient documentation

## 2020-10-27 DIAGNOSIS — I5189 Other ill-defined heart diseases: Secondary | ICD-10-CM | POA: Insufficient documentation

## 2020-10-27 MED ORDER — AMANTADINE HCL 100 MG PO CAPS: 100.0000 mg | ORAL_CAPSULE | Freq: Two times a day (BID) | ORAL | 5 refills | Status: DC

## 2020-10-27 MED ORDER — VALSARTAN 80 MG PO TABS: 80.0000 mg | ORAL_TABLET | Freq: Every day | ORAL | 11 refills | Status: DC

## 2020-10-27 MED ORDER — ASPIRIN EC 81 MG PO TBEC: 81.0000 mg | DELAYED_RELEASE_TABLET | Freq: Every day | ORAL | 11 refills | Status: AC

## 2020-10-27 NOTE — Telephone Encounter (Signed)
-----   Message from Asa Lente, MD sent at 10/26/2020  5:29 PM EDT ----- Please let him know: 1.   The MRI of the brain showed the old stroke on the left and changes from multiple sclerosis. 2.   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 3.   The echocardiogram of the heart showed very mild "diastolic dysfunction".  That does not require special treatment but it is reported that he try to reduce risk factors for the heart such as smoking and hypertension.  4.   I would like him to take 1 baby aspirin daily because of the stroke and the blocked artery.

## 2020-10-27 NOTE — Progress Notes (Signed)
[facility]  PATIENT: Zachary Rasmussen DOB: 1966/04/24  REFERRING CLINICIAN: No primary care physician HISTORY FROM: Patient REASON FOR VISIT: Multiple sclerosis and worsening symptoms    HISTORICAL  CHIEF COMPLAINT:  Chief Complaint  Patient presents with  . Follow-up    RM 12 w/ family. Last seen 07/18/2020. Having SE on Avonex. Wants to discuss other options    HISTORY OF PRESENT ILLNESS:  Zachary Rasmussen is a 55 yo man with multiple sclerosis.      Update 10/27/2020: Since last visit, he had an MRI of the brain that showed a remote left parietal stroke.  Of note, in 2020 he had about 1 to 2 weeks of hallucinations but did not get any imaging studies as he felt he improved after a while.  In 2019, he had a hospitalization for hemodialysis due to acute renal failure and hyperkalemia.  The stroke likely occurred around the time of his hospitalization in 2019 as it was not present on his admission CT scan but was present on an MRI from 11/06/2017.  There are associated chronic heme products noted.  The etiology of the ARF was not clear.  He had been working on his jeep and was found unresponsive and taken to the hospital  Because of the stroke additional studies were ordered at the last visit.  Repeat MRI showed no new MS lesions, the left parietal CVA (liekly from 2019) and an occluded right ICA.   The carotid artery ultrasound showed occlusion of the right internal carotid artery.  The echocardiogram of the heart showed grade 1 diastolic dysfunction.  It was otherwise fairly normal.   He has elevated BP off/on (today is 156/90).     He is feeling much more tired.   He sometimes feels lightheaded when he gets up quick.  He is on Provigil but does not think it has helped the fatigue.  He had been on Ritalin years ago but due to addiction issues we needed to stop.  He also was on pulse steroids with some benefit.     He is noting more issues with headaches and these are  now daily.    He will sometimes have photophobia and phonophobia.   He sometimes has nausea but not vomiting.    He has anxiety > depression .    He is on sertraline 50 mg with mild benefit.  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  He feels his MS is stable.   He stopped Ocrevus 2-3 years ago after an infusion reaction.Marland Kitchen    His last Ocrevus was 07/2017.    He tried switching back to Avonex but felt more tired and achy the next few days.   His gait is off balanced and he has had a few falls, none recently.  Sometimes he has a right foot drop.  Strength is the same with mild right > left  leg weakness     His speech is slurred.   He has some numbness in his hands and feet   Bladder function is about the same.      MS History:    He presented initially with optic neuritis on the right. He got much better but continues to have some blurry vision out of the right eye. About the same time, he also began to notice that his gait was a little off balance. Although the vision has pretty  much been stable, the gait has worsened over the years. He also has had worsening difficulties with attention and with verbal fluency and with memory.    He also notes numbness in the left face with tingling.    He reports difficulty with fatigue and gets benefit from IV steroid.   However, over the last year, the benefit has been less and there was concern for bone loss.   .   He got more benefit from Ritalin .  He did misuse it (taking higher doses in the past and then running out early) and it was discontinued a few years ago.   He was initially placed on Betaseron and switched to Plegridy to have fewer number of injections. While on these medicines he has not had any clearcut exacerbations though he has had some progression of his symptoms.   He tried to quit her but stopped it due to tolerability issues.   He switched to Ocrevus in late 2017 but stopped  in 2020 due to infusion reactions.  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   The stroke was noted on the 2019 MRI and has evolved in the interim..  Multiple T2/FLAIR hypertense foci in the brainstem and hemispheres in a pattern and configuration consistent with chronic demyelinating plaque associated with multiple sclerosis. None of the foci enhance.  No new lesions compared to the MRI from 11/05/2017.  Chronic microhemorrhage in the right hemisphere also present in 2019.   No flow in the right internal carotid artery.   REVIEW OF SYSTEMS:  Constitutional: No fevers, chills, sweats, or change in appetite.  Has fatigue Eyes: No new visual changes, double vision, eye pain Ear, nose and throat: No hearing loss, ear pain, nasal congestion, sore throat Cardiovascular: No chest pain, palpitations Respiratory:  No shortness of breath at rest or with exertion.   No wheezes GastrointestinaI: No nausea, vomiting, diarrhea, abdominal pain, fecal incontinence Genitourinary:  See above    No nocturia. Musculoskeletal:    He continues to have back pain even after surgery.  He has right hip pain Integumentary: No rash, pruritus, skin lesions Neurological: as above Psychiatric: see above. Endocrine: No palpitations, diaphoresis, change in appetite, change in weigh or increased thirst Hematologic/Lymphatic:  No anemia, purpura, petechiae. Allergic/Immunologic: No itchy/runny eyes, nasal congestion, recent allergic reactions, rashes  ALLERGIES: Allergies  Allergen Reactions  . Penicillins Rash    HOME MEDICATIONS: See Med List  PAST MEDICAL HISTORY: Past Medical History:  Diagnosis Date  . Fatigue 06/24/2014  . Multiple sclerosis (HCC) 06/24/2014    PAST SURGICAL HISTORY: Past Surgical History:  Procedure Laterality Date  . BACK SURGERY     x2    FAMILY  HISTORY: Family History  Problem Relation Age of Onset  . Rheum arthritis Father   . Hyperlipidemia Father   . Hypertension Father   . Asthma Son   . Cancer Paternal Grandfather   . Heart failure Son   . Hypertension Mother   . Thyroid disease Neg Hx   . Stroke Neg Hx   . Seizures Neg Hx   . Rashes / Skin problems Neg Hx   . Migraines Neg Hx   . Diabetes Neg Hx     SOCIAL HISTORY:  Social History   Socioeconomic History  . Marital status: Married    Spouse name: Boyd Kerbs  . Number of children: Not  on file  . Years of education: Not on file  . Highest education level: Not on file  Occupational History  . Occupation: unemployed  Tobacco Use  . Smoking status: Current Every Day Smoker    Packs/day: 0.50    Years: 35.00    Pack years: 17.50    Types: Cigarettes  . Smokeless tobacco: Never Used  Substance and Sexual Activity  . Alcohol use: Not Currently    Alcohol/week: 0.0 standard drinks  . Drug use: No  . Sexual activity: Not on file  Other Topics Concern  . Not on file  Social History Narrative   Caffeine use: 1-2 sodas/tea per day   Lives with wife ad ddaughter   Right handed    Social Determinants of Health   Financial Resource Strain: Not on file  Food Insecurity: Not on file  Transportation Needs: Not on file  Physical Activity: Not on file  Stress: Not on file  Social Connections: Not on file  Intimate Partner Violence: Not on file     PHYSICAL EXAM  Vitals:   10/27/20 1519  BP: (!) 156/90  Pulse: 71  Weight: 162 lb 8 oz (73.7 kg)  Height: 5\' 8"  (1.727 m)    Body mass index is 24.71 kg/m.   General: The patient is well-developed and well-nourished and in no acute distress.        Neurologic Exam  Mental status: The patient is alert and oriented x 3 at the time of the examination. The patient has reduced attention span and concentration ability.   Speech is normal.  Cranial nerves: Extraocular movements are full. Strength is normal.  There is reduced facial sensation on the left..  Trapezius and sternocleidomastoid strength is normal.  Voice is slurred   No obvious hearing deficits are noted.  Motor:  Muscle bulk is normal. Tone is mildly increased in the legs. Strength is  5 / 5 in all 4 extremities.   Sensory: Sensory testing is intact to touch in the arms and legs.   Coordination: Cerebellar testing reveals good finger-nose-finger and  reduced heel-to-shin right worse than left.  Gait and station: Station is stable.  The gait is wide with mild right foot drop. The tandem gait is poor.   Romberg is negative.  Reflexes: Deep tendon reflexes are increased at knees with spread, left greater than right. There is no ankle clonus..                                                                          1. Multiple sclerosis (HCC)   2. Cerebrovascular accident (CVA), unspecified mechanism (HCC)   3. Right carotid artery occlusion   4. High risk medication use   5. Cognitive dysfunction   6. Other fatigue   7. Left ventricular diastolic dysfunction, NYHA class 1     1.   He has had difficulty tolerating Avonex and will stop.  Labs for Aubagio 2.   He asked about Ritalin for his fatigue as it has helped in the past.   He had an issue with stimulant misue in the past and is currently on Suboxone.  Therefore we will avoid. Modafinil has not helped so we will have him try  amantadine.   We can do one gram IV SOlu-medrol today.    3.   Stay active.   Exercise as tolerated 4.   Valsartan for HTN.   Advised to f/u with PCP.  Add bASA 5.   rtc 6t months, sooner if problems.      42-minute office visit with the majority of the time spent face-to-face for history and physical, discussion/counseling and decision-making.  Additional time with record review and documentation.  Larkyn Greenberger A. Epimenio Foot, MD, PhD 10/27/2020, 7:43 PM Certified in Neurology, Clinical Neurophysiology, Sleep Medicine, Pain Medicine and Neuroimaging  [facility] [address] [place], [place] [address] [phone]

## 2020-10-28 ENCOUNTER — Telehealth: Payer: Self-pay | Admitting: Neurology

## 2020-10-28 NOTE — Telephone Encounter (Signed)
Pt called, 3 prescriptions from my last office visit were not all at the pharmacy. I received Amantadine and Valsartan, but not the other medication. I can not remember to name of it. Would like a call from the nurse.

## 2020-10-28 NOTE — Telephone Encounter (Signed)
Called pt back. Advised MD called in amantidine, valsartan. He should get ASA 81mg  OTC. He asked about MS DMT. Advised labs pending. Once we get lab results back and he is cleared to start Aubagio we will call him and explain next steps at that point. He verbalized understanding.

## 2020-10-30 LAB — CBC WITH DIFFERENTIAL/PLATELET
Basophils Absolute: 0.1 10*3/uL (ref 0.0–0.2)
Basos: 1 %
EOS (ABSOLUTE): 0.2 10*3/uL (ref 0.0–0.4)
Eos: 2 %
Hematocrit: 41.3 % (ref 37.5–51.0)
Hemoglobin: 14 g/dL (ref 13.0–17.7)
Immature Grans (Abs): 0 10*3/uL (ref 0.0–0.1)
Immature Granulocytes: 0 %
Lymphocytes Absolute: 2.3 10*3/uL (ref 0.7–3.1)
Lymphs: 28 %
MCH: 31.8 pg (ref 26.6–33.0)
MCHC: 33.9 g/dL (ref 31.5–35.7)
MCV: 94 fL (ref 79–97)
Monocytes Absolute: 0.6 10*3/uL (ref 0.1–0.9)
Monocytes: 7 %
Neutrophils Absolute: 5.1 10*3/uL (ref 1.4–7.0)
Neutrophils: 62 %
Platelets: 357 10*3/uL (ref 150–450)
RBC: 4.4 x10E6/uL (ref 4.14–5.80)
RDW: 12.7 % (ref 11.6–15.4)
WBC: 8.3 10*3/uL (ref 3.4–10.8)

## 2020-10-30 LAB — COMPREHENSIVE METABOLIC PANEL
ALT: 8 IU/L (ref 0–44)
AST: 11 IU/L (ref 0–40)
Albumin/Globulin Ratio: 1.8 (ref 1.2–2.2)
Albumin: 4.5 g/dL (ref 3.8–4.9)
Alkaline Phosphatase: 85 IU/L (ref 44–121)
BUN/Creatinine Ratio: 17 (ref 9–20)
BUN: 17 mg/dL (ref 6–24)
Bilirubin Total: 0.3 mg/dL (ref 0.0–1.2)
CO2: 25 mmol/L (ref 20–29)
Calcium: 9.6 mg/dL (ref 8.7–10.2)
Chloride: 105 mmol/L (ref 96–106)
Creatinine, Ser: 1 mg/dL (ref 0.76–1.27)
Globulin, Total: 2.5 g/dL (ref 1.5–4.5)
Glucose: 90 mg/dL (ref 65–99)
Potassium: 4.6 mmol/L (ref 3.5–5.2)
Sodium: 143 mmol/L (ref 134–144)
Total Protein: 7 g/dL (ref 6.0–8.5)
eGFR: 89 mL/min/{1.73_m2} (ref >59)

## 2020-10-30 LAB — QUANTIFERON-TB GOLD PLUS
QuantiFERON Mitogen Value: >10 IU/mL
QuantiFERON Nil Value: 0 IU/mL
QuantiFERON TB1 Ag Value: 0.05 IU/mL
QuantiFERON TB2 Ag Value: 0.03 IU/mL
QuantiFERON-TB Gold Plus: NEGATIVE

## 2020-11-04 ENCOUNTER — Telehealth: Payer: Self-pay | Admitting: Neurology

## 2020-11-04 NOTE — Telephone Encounter (Signed)
Dr. Lars Masson requesting lab results from 10/27/20

## 2020-11-04 NOTE — Telephone Encounter (Signed)
Pt called, have not received a call about my blood work results. Would like a call from the nurse.

## 2020-11-04 NOTE — Telephone Encounter (Signed)
The lab work is fine.  We can send in the Aubagio start form.

## 2020-11-05 ENCOUNTER — Telehealth: Payer: Self-pay | Admitting: *Deleted

## 2020-11-05 NOTE — Telephone Encounter (Signed)
Faxed completed/signed Aubagio start form to [facility] at [phone]. Received fax confirmation. 

## 2020-11-05 NOTE — Telephone Encounter (Signed)
Called pt. Relayed labs ok per MD. Rip Harbour to send in start form he signed for Aubagio. Aware KD,RN will reach out throughout this process of getting him started on med as well as [facility]. He verbalized understanding.

## 2020-11-11 NOTE — Telephone Encounter (Signed)
Completed PA for aubagio via [facility]. Key: [id number]. Sent to [facility]. Should have a determination within 3-5 business days.

## 2020-11-11 NOTE — Telephone Encounter (Signed)
Received this notice from [facility]. "It also looks the patient will need to apply for PAP due to high Part D coinsurance. Please have a manually signed Rx for a 90 day supply with up to 3 refills faxed back along with a cover sheet from you office to fax# [phone]."

## 2020-11-11 NOTE — Telephone Encounter (Signed)
Received this notice from [facility]: "[employer] has approved a 30 day supply for the drug listed above but denied your request for a 90 day supply. The drug you requested is a specialty drug. Your Prescription Drug Guide says that specialty drugs are limited to a 30-day supply. You can view your PDG online at [employer]Famous.is. A full list of pharmacies is also available at The Pharmacy Finder Tool at [employer]gl or you may call customer care at [phone] (TTY: 711), 8 a.m. 8 p.m. EST, seven days a week for a full list of in-network pharmacies. If receiving your drugs via mail-order delivery is more convenient for you, you can visit our list of in-network mail-order pharmacies at [employer].si (30-day supply limit on some specialty drugs also applies to mail-order). [employer] has approved coverage for the drug listed above, up to a 30-day supply per fill, under your Part D benefit for/through 06/13/2021."

## 2020-11-12 MED ORDER — AUBAGIO 14 MG PO TABS: 14.0000 mg | ORAL_TABLET | Freq: Every day | ORAL | 3 refills | Status: DC

## 2020-11-12 NOTE — Addendum Note (Signed)
Addended by: Geronimo Running A on: 11/12/2020 07:43 AM   Modules accepted: Orders

## 2020-11-12 NOTE — Telephone Encounter (Signed)
Faxed Aubagio Rx to [facility].  Received a receipt of confirmation.

## 2020-11-17 NOTE — Telephone Encounter (Signed)
I called patient.  He has been in contact with [employer] regarding patient assistance.  He has not heard whether he has been accepted yet or not.  I will check with him next week.

## 2020-11-24 NOTE — Telephone Encounter (Signed)
I spoke with Angelique at [employer].  They have emailed him the patient assistance application per his request.  They will follow-up with him today regarding this.  I called patient.  I advised him that the application was sent to his email.  He has not checked his email.  He will check it and let them know if he has questions.

## 2020-11-24 NOTE — Telephone Encounter (Signed)
I called patient.  He has not heard if he has been accepted into the assistance program with [employer] yet.  I will check on this for him and let him know.

## 2020-12-01 ENCOUNTER — Telehealth: Payer: Self-pay | Admitting: *Deleted

## 2020-12-01 NOTE — Telephone Encounter (Signed)
Received this notice from Angelique at [employer]: "We emailed the PAP app to the pt as he requested on 11/14/2020. On 11/25/2020 the pt then reported he is unable to print the PAP application so he requested we mail it to him that day. So now we are dealing with snail mail."

## 2020-12-01 NOTE — Telephone Encounter (Signed)
LVM for pt letting him know I received VO from Dr. Epimenio Foot to bring him in for 1G IV solumedrol x1 day. Asked him to call back to confirm he received this message and what time he will be coming. Liane, RN said for him to come asap.

## 2020-12-08 NOTE — Telephone Encounter (Signed)
I called patient.  He received the PAP application in the mail.  He was told to call [employer] when he received this form and they would help him complete it via phone.  Patient has had trouble reaching them.  Patient will call them again today.  I will check on patient next week.

## 2020-12-16 ENCOUNTER — Telehealth: Payer: Self-pay | Admitting: Neurology

## 2020-12-16 NOTE — Telephone Encounter (Signed)
Called pt back. He went to hospital last Friday.Started d/t tooth ache. Pain got intense so he went to ED. States he did two CT scans and MRI. Was told everything looked ok. Ruled out stroke.  Numbness located in lower left jaw and is constant. Has improved some. Denies any signs/sx of infection. Made appt to see dentist.Unsure when wife made appt for.  He is convinced numbness d/t MS and would like to know what Dr. Epimenio Foot recommends.

## 2020-12-16 NOTE — Telephone Encounter (Signed)
I checked with [facility] [employer] and they still have not received his PAP for Aubagio.

## 2020-12-16 NOTE — Telephone Encounter (Signed)
Pt called, last Friday had a MS episode; face numbness; went to [facility]. Would like a call from the nurse.

## 2020-12-17 NOTE — Telephone Encounter (Signed)
Called and LVM for pt relaying Dr. Bonnita Hollow message. Advised he should plan to follow up with Dentist as scheduled and to call if he develops any new or worsening sx.

## 2020-12-22 NOTE — Telephone Encounter (Signed)
Received this notice from [employer].: "We received the PAP app today, and the pt was approved for PAP today through 06/13/2021. 1st shipment is scheduled to go out today."

## 2020-12-29 NOTE — Telephone Encounter (Signed)
I called patient.  He has started taking Aubagio.  He is tolerating it fairly well.  I reminded him of his August 4th appointment with Dr. Epimenio Foot.  We will check LFTs at that appointment.  I reminded patient that he will need monthly LFTs for the next 6 months.  Patient is agreeable to this.  Patient will let us know if he has questions or concerns.

## 2021-01-15 ENCOUNTER — Ambulatory Visit: Payer: Medicare PPO | Admitting: Neurology

## 2021-01-15 ENCOUNTER — Encounter: Payer: Self-pay | Admitting: Neurology

## 2021-01-15 VITALS — BP 160/97 | HR 76 | Ht 66.0 in | Wt 157.5 lb

## 2021-01-15 DIAGNOSIS — Z79899 Other long term (current) drug therapy: Secondary | ICD-10-CM | POA: Diagnosis not present

## 2021-01-15 DIAGNOSIS — I6521 Occlusion and stenosis of right carotid artery: Secondary | ICD-10-CM | POA: Diagnosis not present

## 2021-01-15 DIAGNOSIS — F418 Other specified anxiety disorders: Secondary | ICD-10-CM

## 2021-01-15 DIAGNOSIS — G35 Multiple sclerosis: Secondary | ICD-10-CM | POA: Diagnosis not present

## 2021-01-15 DIAGNOSIS — R5383 Other fatigue: Secondary | ICD-10-CM

## 2021-01-15 DIAGNOSIS — R260 Ataxic gait: Secondary | ICD-10-CM

## 2021-01-15 DIAGNOSIS — I639 Cerebral infarction, unspecified: Secondary | ICD-10-CM | POA: Diagnosis not present

## 2021-01-15 NOTE — Progress Notes (Signed)
[facility]  PATIENT: Zachary Rasmussen DOB: 1965/12/06  REFERRING CLINICIAN: No primary care physician HISTORY FROM: Patient REASON FOR VISIT: Multiple sclerosis and worsening symptoms    HISTORICAL  CHIEF COMPLAINT:  Chief Complaint  Patient presents with   Follow-up    Rm 1, alone. Here for 6 month MS f/u, pt reports no change since OV. Pt was off of Amantadine for 1 week, had no money to pick up refill and caused him to get bad,he is now taking it regularly.     HISTORY OF PRESENT ILLNESS:  Zachary Rasmussen is a 55 yo man with multiple sclerosis.      Update 01/15/2021: He is on Aubagio and tolerates it well.   He has no exacerbation or new neurologic symptom   He tolerate it better than Ocrevus and Avonex in the past.   His gait is off balanced and he has had a few falls, none recently.  Sometimes he has a right foot drop.  Strength is the same with mild right > left  leg weakness     His speech is slurred.   He has some numbness in his hands and feet   Bladder function is about the same.     He is on amantadine and notes some benefit.     He had been on Ritalin years ago but due to addiction issues we needed to stop.  He also was on pulse steroids with some benefit.     Because of the stroke, additional studies were ordered at the last visit.  Repeat MRI showed no new MS lesions, the left parietal CVA (liekly from 2019) and an occluded right ICA.   The carotid artery ultrasound showed occlusion of the right internal carotid artery.  The echocardiogram of the heart showed grade 1 diastolic dysfunction.  It was otherwise fairly normal.   He has elevated BP off/on (today is 156/90).     He is noting more issues with headaches and these are now daily.    He will sometimes have photophobia and phonophobia.   He sometimes has nausea but not vomiting.    His back is bothering him more and he is being scheduled to go to a pain clinic.   Yardwork and other chores intensifies  the pain.    He has anxiety > depression .    He is on sertraline 50 mg with mild benefit.  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   MS History:    He presented initially with optic neuritis on the right. He got much better but continues to have some blurry vision out of the right eye. About the same time, he also began to notice that his gait was a little off balance. Although the vision has pretty much been stable, the gait has worsened over the years. He also has had worsening difficulties with attention and with verbal fluency and with memory.    He also notes numbness in the left face with tingling.    He reports difficulty with fatigue and gets benefit from IV steroid.   However, over the last year, the benefit has been less and there was concern for bone loss.   .   He got more benefit from Ritalin .  He did misuse it (taking higher doses in the past and then running out early) and it was discontinued a few years ago.  He was initially placed on Betaseron and switched to Plegridy to have fewer number of injections. While on these medicines he has not had any clearcut exacerbations though he has had some progression of his symptoms.   He tried to quit her but stopped it due to tolerability issues.   He switched to Ocrevus in late 2017 but stopped in 2020 due to infusion reactions.  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    The stroke was noted on the 2019 MRI and has evolved in the interim..    Multiple T2/FLAIR hypertense foci in the brainstem and hemispheres in a pattern and configuration consistent with chronic demyelinating plaque associated with multiple sclerosis.  None of the foci enhance.  No new lesions compared to the MRI from 11/05/2017.   Chronic microhemorrhage in the right  hemisphere also present in 2019.   No flow in the right internal carotid artery.   REVIEW OF SYSTEMS:  Constitutional: No fevers, chills, sweats, or change in appetite.  Has fatigue Eyes: No new visual changes, double vision, eye pain Ear, nose and throat: No hearing loss, ear pain, nasal congestion, sore throat Cardiovascular: No chest pain, palpitations Respiratory:  No shortness of breath at rest or with exertion.   No wheezes GastrointestinaI: No nausea, vomiting, diarrhea, abdominal pain, fecal incontinence Genitourinary:  See above    No nocturia. Musculoskeletal:    He continues to have back pain even after surgery.  He has right hip pain Integumentary: No rash, pruritus, skin lesions Neurological: as above Psychiatric: see above. Endocrine: No palpitations, diaphoresis, change in appetite, change in weigh or increased thirst Hematologic/Lymphatic:  No anemia, purpura, petechiae. Allergic/Immunologic: No itchy/runny eyes, nasal congestion, recent allergic reactions, rashes  ALLERGIES: Allergies  Allergen Reactions   Penicillins Rash    HOME MEDICATIONS: See Med List  PAST MEDICAL HISTORY: Past Medical History:  Diagnosis Date   Fatigue 06/24/2014   Multiple sclerosis (HCC) 06/24/2014    PAST SURGICAL HISTORY: Past Surgical History:  Procedure Laterality Date   BACK SURGERY     x2    FAMILY HISTORY: Family History  Problem Relation Age of Onset   Rheum arthritis Father    Hyperlipidemia Father    Hypertension Father    Asthma Son    Cancer Paternal Grandfather    Heart failure Son    Hypertension Mother    Thyroid disease Neg Hx    Stroke Neg Hx    Seizures Neg Hx    Rashes / Skin problems Neg Hx    Migraines Neg Hx    Diabetes Neg Hx     SOCIAL HISTORY:  Social History   Socioeconomic History   Marital status: Married    Spouse name: Boyd Kerbsenny   Number of children: Not on file   Years of education: Not on file   Highest education level: Not on  file  Occupational History   Occupation: unemployed  Tobacco Use   Smoking status: Every Day    Packs/day: 0.50    Years: 35.00    Pack years: 17.50    Types: Cigarettes   Smokeless tobacco: Never  Substance and Sexual Activity   Alcohol use: Not Currently    Alcohol/week: 0.0 standard drinks   Drug use: No   Sexual activity: Not on file  Other Topics Concern   Not on file  Social History Narrative  Caffeine use: 1-2 sodas/tea per day   Lives with wife ad ddaughter   Right handed    Social Determinants of Health   Financial Resource Strain: Not on file  Food Insecurity: Not on file  Transportation Needs: Not on file  Physical Activity: Not on file  Stress: Not on file  Social Connections: Not on file  Intimate Partner Violence: Not on file     PHYSICAL EXAM  Vitals:   01/15/21 1603  BP: (!) 160/97  Pulse: 76  Weight: 157 lb 8 oz (71.4 kg)  Height: 5\' 6"  (1.676 m)    Body mass index is 25.42 kg/m.   General: The patient is well-developed and well-nourished and in no acute distress.        Neurologic Exam  Mental status: The patient is alert and oriented x 3 at the time of the examination. The patient has reduced attention span and concentration ability.   Speech is normal.  Cranial nerves: Extraocular movements are full. Strength is normal. There is reduced facial sensation on the left..  Trapezius and sternocleidomastoid strength is normal.  Voice is slurred   No obvious hearing deficits are noted.  Motor:  Muscle bulk is normal. Tone is mildly increased in the legs. Strength is  5 / 5 in all 4 extremities.   Sensory: Sensory testing is intact to touch in the arms and legs.   Coordination: Cerebellar testing reveals good finger-nose-finger and  reduced heel-to-shin right worse than left.  Gait and station: Station is stable.  The gait is wide with mild right foot drop. The tandem gait is poor.   Romberg is negative.  Reflexes: Deep tendon reflexes are  increased at knees with spread, left greater than right. There is no ankle clonus..                                                                          1. Multiple sclerosis (HCC)   2. Cerebrovascular accident (CVA), unspecified mechanism (HCC)   3. Right carotid artery occlusion   4. High risk medication use   5. Ataxic gait   6. Other fatigue   7. Depression with anxiety      1.   He has had difficulty tolerating Avonex and will stop.  Labs for Aubagio 2.   He asked about Ritalin for his fatigue as it has helped in the past.   He had an issue with stimulant misue in the past and is currently on Suboxone.  Therefore we will avoid. Modafinil has not helped so we will have him try amantadine.   We can do one gram IV SOlu-medrol today.    3.   Stay active.   Exercise as tolerated 4.   Valsartan for HTN.   Advised to f/u with PCP.  Add bASA 5.   rtc 6t months, sooner if problems.      42-minute office visit with the majority of the time spent face-to-face for history and physical, discussion/counseling and decision-making.  Additional time with record review and documentation.  Shayle Donahoo A. Epimenio Foot, MD, PhD 01/15/2021, 4:36 PM Certified in Neurology, Clinical Neurophysiology, Sleep Medicine, Pain Medicine and Neuroimaging  [facility] [address], [address] [place], [place]  [address] [phone]                                                                                                                                                                                                                                                                                                                                                                                                                                                                                                                                                                                                                                                                  [facility]  PATIENT: Zachary Rasmussen DOB: 1965-10-20  REFERRING CLINICIAN: No primary care physician HISTORY FROM: Patient REASON FOR VISIT: Multiple sclerosis and worsening symptoms    HISTORICAL  CHIEF COMPLAINT:  Chief Complaint  Patient presents with   Follow-up    Rm 1, alone. Here for 6 month MS f/u, pt reports no change since OV. Pt was off of Amantadine for 1 week, had no money to pick up refill and caused him to get bad,he is now taking it regularly.     HISTORY OF PRESENT ILLNESS:  Zachary Rasmussen is a 55 yo man with multiple sclerosis.      Update 10/27/2020: Since last visit, he had an MRI of the brain that showed a remote left parietal stroke.  Of note, in 2020 he had about 1 to 2 weeks of hallucinations but did not get any imaging studies as he felt he improved after a while.  In 2019, he had a hospitalization for hemodialysis due to acute renal failure and hyperkalemia.  The stroke likely occurred around the time of his hospitalization in 2019 as it was not present on his admission CT scan but was present on an MRI from 11/06/2017.  There are associated chronic heme products noted.  The etiology of the ARF was not clear.  He had been working on his jeep and was found unresponsive and taken to the hospital  Because of the stroke additional studies were ordered at the last visit.  Repeat MRI showed no new MS lesions, the left parietal CVA (liekly from 2019) and an occluded right ICA.   The carotid artery ultrasound showed occlusion of the right internal carotid artery.  The echocardiogram of the heart showed grade 1 diastolic dysfunction.  It was otherwise fairly normal.   He has elevated BP off/on (today is 156/90).     He is feeling much more tired.   He sometimes feels lightheaded when he gets up quick.  He is on Provigil but does not think it has helped the fatigue.  He had been on Ritalin years ago but due  to addiction issues we needed to stop.  He also was on pulse steroids with some benefit.     He is noting more issues with headaches and these are now daily.    He will sometimes have photophobia and phonophobia.   He sometimes has nausea but not vomiting.    He has anxiety > depression .    He is on sertraline 50 mg with mild benefit.  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  He feels his MS is stable.   He stopped Ocrevus 2-3 years ago after an infusion reaction.Marland Kitchen    His last Ocrevus was 07/2017.    He tried switching back to Avonex but felt more tired and achy the next few days.   His gait is off balanced and he has had a few falls, none recently.  Sometimes he has a right foot drop.  Strength is the same with mild right > left  leg weakness     His speech is slurred.   He has some numbness in his hands and feet   Bladder function is about the same.      MS History:    He presented initially with optic neuritis on the right. He got much better but continues to have some blurry vision out of  the right eye. About the same time, he also began to notice that his gait was a little off balance. Although the vision has pretty much been stable, the gait has worsened over the years. He also has had worsening difficulties with attention and with verbal fluency and with memory.    He also notes numbness in the left face with tingling.    He reports difficulty with fatigue and gets benefit from IV steroid.   However, over the last year, the benefit has been less and there was concern for bone loss.   .   He got more benefit from Ritalin .  He did misuse it (taking higher doses in the past and then running out early) and it was discontinued a few years ago.   He was initially placed on Betaseron and switched to Plegridy to have fewer number of injections. While on these medicines he has not had any clearcut exacerbations though he has had  some progression of his symptoms.   He tried to quit her but stopped it due to tolerability issues.   He switched to Ocrevus in late 2017 but stopped in 2020 due to infusion reactions.  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    The stroke was noted on the 2019 MRI and has evolved in the interim..    Multiple T2/FLAIR hypertense foci in the brainstem and hemispheres in a pattern and configuration consistent with chronic demyelinating plaque associated with multiple sclerosis.  None of the foci enhance.  No new lesions compared to the MRI from 11/05/2017.   Chronic microhemorrhage in the right hemisphere also present in 2019.   No flow in the right internal carotid artery.   REVIEW OF SYSTEMS:  Constitutional: No fevers, chills, sweats, or change in appetite.  Has fatigue Eyes: No new visual changes, double vision, eye pain Ear, nose and throat: No hearing loss, ear pain, nasal congestion, sore throat Cardiovascular: No chest pain, palpitations Respiratory:  No shortness of breath at rest or with exertion.   No wheezes GastrointestinaI: No nausea, vomiting, diarrhea, abdominal pain, fecal incontinence Genitourinary:  See above    No nocturia. Musculoskeletal:    He continues to have back pain even after surgery.  He has right hip pain Integumentary: No rash, pruritus, skin lesions Neurological: as above Psychiatric: see above. Endocrine: No palpitations, diaphoresis, change in appetite, change in weigh or increased thirst Hematologic/Lymphatic:  No anemia, purpura, petechiae. Allergic/Immunologic: No itchy/runny eyes, nasal congestion, recent allergic reactions, rashes  ALLERGIES: Allergies  Allergen Reactions   Penicillins Rash    HOME MEDICATIONS: See Med List  PAST MEDICAL HISTORY: Past Medical History:  Diagnosis Date   Fatigue 06/24/2014   Multiple  sclerosis (HCC) 06/24/2014    PAST SURGICAL HISTORY: Past Surgical History:  Procedure Laterality Date   BACK SURGERY     x2    FAMILY HISTORY: Family History  Problem Relation Age of Onset   Rheum arthritis Father    Hyperlipidemia Father    Hypertension Father    Asthma Son    Cancer Paternal Grandfather    Heart failure Son    Hypertension Mother    Thyroid disease Neg Hx    Stroke Neg Hx    Seizures Neg Hx    Rashes / Skin problems Neg Hx    Migraines Neg Hx    Diabetes Neg Hx  SOCIAL HISTORY:  Social History   Socioeconomic History   Marital status: Married    Spouse name: Boyd Kerbs   Number of children: Not on file   Years of education: Not on file   Highest education level: Not on file  Occupational History   Occupation: unemployed  Tobacco Use   Smoking status: Every Day    Packs/day: 0.50    Years: 35.00    Pack years: 17.50    Types: Cigarettes   Smokeless tobacco: Never  Substance and Sexual Activity   Alcohol use: Not Currently    Alcohol/week: 0.0 standard drinks   Drug use: No   Sexual activity: Not on file  Other Topics Concern   Not on file  Social History Narrative   Caffeine use: 1-2 sodas/tea per day   Lives with wife ad ddaughter   Right handed    Social Determinants of Health   Financial Resource Strain: Not on file  Food Insecurity: Not on file  Transportation Needs: Not on file  Physical Activity: Not on file  Stress: Not on file  Social Connections: Not on file  Intimate Partner Violence: Not on file     PHYSICAL EXAM  Vitals:   01/15/21 1603  BP: (!) 160/97  Pulse: 76  Weight: 157 lb 8 oz (71.4 kg)  Height: 5\' 6"  (1.676 m)    Body mass index is 25.42 kg/m.   General: The patient is well-developed and well-nourished and in no acute distress.        Neurologic Exam  Mental status: The patient is alert and oriented x 3 at the time of the examination. The patient has reduced attention span and concentration  ability.   Speech is normal.  Cranial nerves: Extraocular movements are full. Strength is normal. There is reduced facial sensation on the left..  Trapezius and sternocleidomastoid strength is normal.  Voice is slurred   No obvious hearing deficits are noted.  Motor:  Muscle bulk is normal. Tone is mildly increased in the legs. Strength is  5 / 5 in all 4 extremities.   Sensory: Sensory testing is intact to touch in the arms and legs.   Coordination: Cerebellar testing reveals good finger-nose-finger and  reduced heel-to-shin right worse than left.  Gait and station: Station is stable.  The gait is wide.  He has a mild right foot drop.  The tandem gait is poor.  Romberg is negative.  Reflexes: Deep tendon reflexes are increased at knees with spread, left greater than right. There is no ankle clonus..                                                                          1. Multiple sclerosis (HCC)   2. Cerebrovascular accident (CVA), unspecified mechanism (HCC)   3. Right carotid artery occlusion   4. High risk medication use   5. Ataxic gait   6. Other fatigue   7. Depression with anxiety      1.   Continue Aubagio.  Check liver function test. 2.   Continue amantadine 3.   Stay active.   Exercise as tolerated 4.   continue bASA 5.   rtc 6 months, sooner if problems.  Zakai Gonyea A. Epimenio Foot, MD, PhD 01/15/2021, 4:36 PM Certified in Neurology, Clinical Neurophysiology, Sleep Medicine, Pain Medicine and Neuroimaging  [facility] [address] [place], [place] [address] [phone]

## 2021-01-16 LAB — HEPATIC FUNCTION PANEL
ALT: 8 IU/L (ref 0–44)
AST: 16 IU/L (ref 0–40)
Albumin: 4.6 g/dL (ref 3.8–4.9)
Alkaline Phosphatase: 89 IU/L (ref 44–121)
Bilirubin Total: 0.7 mg/dL (ref 0.0–1.2)
Bilirubin, Direct: 0.15 mg/dL (ref 0.00–0.40)
Total Protein: 7 g/dL (ref 6.0–8.5)

## 2021-01-19 ENCOUNTER — Telehealth: Payer: Self-pay | Admitting: Neurology

## 2021-01-19 NOTE — Telephone Encounter (Signed)
I called pharmacy back. Advised pt not on Avonex, on Aubagio, getting via MS one to one. They verbalized understanding.

## 2021-01-19 NOTE — Telephone Encounter (Signed)
[facility]) called, wanting to verify if patient is still on Avonex. If not what medication patient currently on. Would like a call from the nurse.

## 2021-01-19 NOTE — Telephone Encounter (Signed)
Called the patient and reviewed the normal lab results with him. Pt verbalized understanding. Pt had no questions at this time but was encouraged to call back if questions arise.

## 2021-01-19 NOTE — Telephone Encounter (Signed)
-----   Message from Asa Lente, MD sent at 01/16/2021 12:52 PM EDT ----- Please let him know that the lab work looks good.

## 2021-02-24 ENCOUNTER — Telehealth: Payer: Self-pay

## 2021-02-24 NOTE — Telephone Encounter (Signed)
I called patient.  I reminded him to come in for his monthly LFTs on Aubagio.  I gave him our office hours.

## 2021-02-25 ENCOUNTER — Telehealth: Payer: Self-pay

## 2021-02-25 NOTE — Telephone Encounter (Signed)
Patient called office and I was asked by phone staff to take this call.  Patient reports that he would like to speak only to Dr. Epimenio Foot about a question on his Aubagio.  He refused to give any further details.  He denies having side effects on Aubagio but insists he will only speak with Dr. Epimenio Foot.  I offer patient appointment for tomorrow but patient declined this appointment due to transportation issues.  I offered to place patient on the wait list for sooner appointment.  Patient is agreeable to this.

## 2021-03-09 NOTE — Telephone Encounter (Signed)
I called patient to offer him appointments on 03/12/21 with Dr. Epimenio Foot. No answer, left a message asking him to call me back.

## 2021-03-12 ENCOUNTER — Ambulatory Visit (INDEPENDENT_AMBULATORY_CARE_PROVIDER_SITE_OTHER): Payer: Medicare PPO | Admitting: Neurology

## 2021-03-12 ENCOUNTER — Encounter: Payer: Self-pay | Admitting: Neurology

## 2021-03-12 DIAGNOSIS — G35 Multiple sclerosis: Secondary | ICD-10-CM | POA: Diagnosis not present

## 2021-03-12 DIAGNOSIS — Z79899 Other long term (current) drug therapy: Secondary | ICD-10-CM

## 2021-03-12 DIAGNOSIS — I639 Cerebral infarction, unspecified: Secondary | ICD-10-CM

## 2021-03-12 DIAGNOSIS — M542 Cervicalgia: Secondary | ICD-10-CM

## 2021-03-12 DIAGNOSIS — I6521 Occlusion and stenosis of right carotid artery: Secondary | ICD-10-CM | POA: Diagnosis not present

## 2021-03-12 NOTE — Progress Notes (Signed)
[facility]  PATIENT: Zachary Rasmussen DOB: August 29, 1965  REFERRING CLINICIAN: No primary care physician HISTORY FROM: Patient   REASON FOR VISIT: Multiple sclerosis and worsening symptoms    HISTORICAL  CHIEF COMPLAINT:  No chief complaint on file.   HISTORY OF PRESENT ILLNESS:  Zachary Rasmussen is a 55 yo man with multiple sclerosis.      Virtual Visit via Telephone Note  I connected with Zachary Rasmussen on 03/12/21 at  2:00 PM EDT by telephone and verified that I am speaking with the correct person using two identifiers.  Location: Patient: home Provider: office   I discussed the limitations, risks, security and privacy concerns of performing an evaluation and management service by telephone and the availability of in person appointments. I also discussed with the patient that there may be a patient responsible charge related to this service. The patient expressed understanding and agreed to proceed.   Update 03/12/2021: He was on Aubagio but stopped a couple weeks ago.   He may have had trouble tolerating it.   Unsure if due to Aubagio or other medications (was on Suboxone and then was on methadone (on it now)).  He is doing better with his pain and tolerates it well.   He has no exacerbation or new neurologic symptom   He tolerate it better than Ocrevus and Avonex in the past.   His gait is off balanced .  No recent falls but stumbles some.   Sometimes he has a right foot drop .   His speech is lessslurred.   He has some numbness in his hands and feet   Bladder function is about the same.     He is on amantadine and notes some benefit.     He had been on Ritalin years ago but due to addiction issues we needed to stop.  He also was on pulse steroids with some benefit.     He has neck and back pain.    He is noting more issues with headaches.   He will sometimes have photophobia and phonophobia.   He sometimes has nausea but not vomiting.    His back is bothering  him more and he is being scheduled to go to a pain clinic.   Yardwork and other chores intensifies the pain.  He now sees pain management and is on methadone 75 mg daily.    He has anxiety > depression .    He is on sertraline 50 mg with mild benefit.  .    MS History:    He presented initially with optic neuritis on the right. He got much better but continues to have some blurry vision out of the right eye. About the same time, he also began to notice that his gait was a little off balance. Although the vision has pretty much been stable, the gait has worsened over the years. He also has had worsening difficulties with attention and with verbal fluency and with memory.    He also notes numbness in the left face with tingling.    He reports difficulty with fatigue and gets benefit from IV steroid.   However, over the last year, the benefit has been less and there was concern for bone loss.   .   He got more benefit from Ritalin .  He did misuse it (taking higher doses in the past and then running out early) and it was discontinued a few years ago.   He was initially placed  on Betaseron and switched to Plegridy to have fewer number of injections. While on these medicines he has not had any clearcut exacerbations though he has had some progression of his symptoms.   He tried to quit her but stopped it due to tolerability issues.   He switched to Ocrevus in late 2017 but stopped in 2020 due to infusion reactions.  He tried Avonex again in 2022 but felt very fatigued and stopped.   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    The stroke was noted on the 2019 MRI and has evolved in the interim..    Multiple T2/FLAIR hypertense foci in the brainstem and hemispheres in a pattern and configuration consistent with chronic demyelinating plaque associated with multiple sclerosis.  None of the foci enhance.  No new lesions  compared to the MRI from 11/05/2017.   Chronic microhemorrhage in the right hemisphere also present in 2019.   No flow in the right internal carotid artery.  MRI brain and MRA 12/12/2020 showed no new MS lesions, the left parietal CVA (likely from 2019) and an occluded right ICA.     July 2022:  The carotid artery ultrasound showed occlusion of the right internal carotid artery.  The echocardiogram of the heart showed grade 1 diastolic dysfunction.  It was otherwise fairly normal.   He has elevated BP off/on (today is 156/90).     Assessment and Plan: Multiple sclerosis (HCC)  Cerebrovascular accident (CVA), unspecified mechanism (HCC)  Right carotid artery occlusion  High risk medication use  Neck pain    1.   Retry  Aubagio after on stable dose of methadone --- if still can't tolerate, he will let us know 2.   Amantadine for fatigue 3.   Valsartan for HTN.  bASA for CVA prophylaxis 4   rtc 6t months, sooner if problems.       Follow Up Instructions:    I discussed the assessment and treatment plan with the patient. The patient was provided an opportunity to ask questions and all were answered. The patient agreed with the plan and demonstrated an understanding of the instructions.   The patient was advised to call back or seek an in-person evaluation if the symptoms worsen or if the condition fails to improve as anticipated.  I provided 18 minutes of non-face-to-face time during this encounter.  Sharlett Lienemann A. Epimenio Foot, MD, PhD, FAAN Certified in Neurology, Clinical Neurophysiology, Sleep Medicine, Pain Medicine and Neuroimaging Director, [facility] at [facility]  [facility] [address], [address] [place], [place] [address] [phone]     ALLERGIES: Allergies  Allergen Reactions   Penicillins Rash    HOME MEDICATIONS: See Med List  PAST MEDICAL HISTORY: Past Medical History:  Diagnosis Date   Fatigue 06/24/2014    Multiple sclerosis (HCC) 06/24/2014    PAST SURGICAL HISTORY: Past Surgical History:  Procedure Laterality Date   BACK SURGERY     x2    FAMILY HISTORY: Family History  Problem Relation Age of Onset   Rheum arthritis Father    Hyperlipidemia Father    Hypertension Father    Asthma Son    Cancer Paternal Grandfather    Heart failure Son    Hypertension Mother    Thyroid disease Neg Hx    Stroke Neg Hx    Seizures Neg Hx    Rashes / Skin problems Neg Hx  Migraines Neg Hx    Diabetes Neg Hx     SOCIAL HISTORY:  Social History   Socioeconomic History   Marital status: Married    Spouse name: Boyd Kerbs   Number of children: Not on file   Years of education: Not on file   Highest education level: Not on file  Occupational History   Occupation: unemployed  Tobacco Use   Smoking status: Every Day    Packs/day: 0.50    Years: 35.00    Pack years: 17.50    Types: Cigarettes   Smokeless tobacco: Never  Substance and Sexual Activity   Alcohol use: Not Currently    Alcohol/week: 0.0 standard drinks   Drug use: No   Sexual activity: Not on file  Other Topics Concern   Not on file  Social History Narrative   Caffeine use: 1-2 sodas/tea per day   Lives with wife ad ddaughter   Right handed    Social Determinants of Health   Financial Resource Strain: Not on file  Food Insecurity: Not on file  Transportation Needs: Not on file  Physical Activity: Not on file  Stress: Not on file  Social Connections: Not on file  Intimate Partner Violence: Not on file

## 2021-05-06 ENCOUNTER — Telehealth: Payer: Self-pay

## 2021-05-06 NOTE — Telephone Encounter (Signed)
I called patient. He has not restarted aubagio yet. He plans on restarting it within the next few months. He will let me know when he restarts it so that we can schedule his monthly LFTs.

## 2021-05-27 ENCOUNTER — Telehealth: Payer: Self-pay | Admitting: *Deleted

## 2021-05-27 NOTE — Telephone Encounter (Signed)
Submitted PA Aubagio on CMM. Key: [id number]. Waiting on determination from [facility].

## 2021-05-27 NOTE — Telephone Encounter (Signed)
PA approved until 06/13/22 for up to 30 days supply. Limited to 30 days d/t being specialty drug. Request for 90days supply denied for this reason. Faxed notice of approval to [employer] at [phone]. Received fax confirmation.

## 2021-06-01 ENCOUNTER — Telehealth: Payer: Self-pay | Admitting: Neurology

## 2021-06-01 NOTE — Telephone Encounter (Signed)
Called pt back. Last night, lost eye sight in right eye for about 30 min. He was watching TV. Vision started getting darker gradually and then he lost complete sight. Left eye vision ok. No headache/migraine history. Does not feel this is r/t to migraine or headache. He will contact eye doctor to get evaluated. He will ask them to forward Dr. Epimenio Foot notes to review once they complete. He will call back if he has any more questions/concerns.

## 2021-06-01 NOTE — Telephone Encounter (Signed)
Pt called states the other night his eye sight went completely black for about 30 mins. Pt requesting  a call back.

## 2021-06-04 NOTE — Telephone Encounter (Signed)
Noted  

## 2021-06-04 NOTE — Telephone Encounter (Signed)
FYI- Pt called states the loss of vision was not due to MS.

## 2021-07-28 ENCOUNTER — Ambulatory Visit: Payer: Medicare PPO | Admitting: Neurology

## 2021-07-28 ENCOUNTER — Telehealth: Payer: Self-pay | Admitting: Neurology

## 2021-07-28 NOTE — Telephone Encounter (Signed)
Pt cancelled appt due to having car trouble. Will not be fixed in time for appt.

## 2021-09-23 ENCOUNTER — Ambulatory Visit: Payer: Medicare PPO | Admitting: Neurology

## 2022-02-04 ENCOUNTER — Ambulatory Visit (INDEPENDENT_AMBULATORY_CARE_PROVIDER_SITE_OTHER): Payer: Medicare PPO | Admitting: Neurology

## 2022-02-04 ENCOUNTER — Encounter: Payer: Self-pay | Admitting: Neurology

## 2022-02-04 VITALS — BP 143/87 | HR 62 | Ht 68.0 in | Wt 165.0 lb

## 2022-02-04 DIAGNOSIS — Z79899 Other long term (current) drug therapy: Secondary | ICD-10-CM | POA: Diagnosis not present

## 2022-02-04 DIAGNOSIS — R5383 Other fatigue: Secondary | ICD-10-CM

## 2022-02-04 DIAGNOSIS — I6521 Occlusion and stenosis of right carotid artery: Secondary | ICD-10-CM

## 2022-02-04 DIAGNOSIS — I639 Cerebral infarction, unspecified: Secondary | ICD-10-CM

## 2022-02-04 DIAGNOSIS — G35 Multiple sclerosis: Secondary | ICD-10-CM

## 2022-02-04 DIAGNOSIS — Z720 Tobacco use: Secondary | ICD-10-CM

## 2022-02-04 MED ORDER — MODAFINIL 200 MG PO TABS: 200.0000 mg | ORAL_TABLET | Freq: Every day | ORAL | 5 refills | Status: AC

## 2022-02-04 MED ORDER — METHYLPREDNISOLONE 4 MG PO TABS: ORAL_TABLET | ORAL | 0 refills | Status: AC

## 2022-02-04 NOTE — Progress Notes (Signed)
[facility]  PATIENT: Zachary Rasmussen DOB: 1965/11/15  REFERRING CLINICIAN: No primary care physician HISTORY FROM: Patient  REASON FOR VISIT: Multiple sclerosis and worsening symptoms    HISTORICAL  CHIEF COMPLAINT:  Chief Complaint  Patient presents with   Follow-up    Pt with wife, rm 1. States overall things are stable with the exception of the energy level being low. MS Follow up, off DMT > year    HISTORY OF PRESENT ILLNESS:  Zachary Rasmussen is a 56 yo man with multiple sclerosis.      Update 02/04/2022: He stopped Aubagio last year.   No DMT x 1 year.  .   He has no exacerbation or new neurologic symptom   He tolerated Aubagio better than Ocrevus and Avonex in the past.   He reports BP is doing well.   He stopped his cholesterol medication.    He still smokes 3/4 ppd and trying to cur down further  His gait is off balanced with some stumbles but no falls.    Sometimes he has a right foot drop.  Strength is the same with mild right > left  leg weakness     His speech is slurred.   He has some numbness in his hands and feet   Bladder function is about the same.     His main problem is no energy and he is ofte sleepy.   He was on amantadine and modafinil in the past without any benefit.    He had been on Ritalin years ago but due to addiction issues we needed to stop.  He also was on pulse steroids with some benefit.     Because of the stroke, additional studies were ordered at the last visit.  Repeat MRI showed no new MS lesions, the left parietal CVA (liekly from 2019) and an occluded right ICA.   The carotid artery ultrasound showed occlusion of the right internal carotid artery.  The echocardiogram of the heart showed grade 1 diastolic dysfunction.  It was otherwise fairly normal.   He has elevated BP off/on (today is 156/90).     He is noting more issues with headaches and these are now daily.    He will sometimes have photophobia and phonophobia.   He  sometimes has nausea but not vomiting.    His back is bothering him more and he is being scheduled to go to a pain clinic.   Yardwork and other chores intensifies the pain.    He has anxiety > depression .    He is on sertraline 50 mg with mild benefit.  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   MS History:    He presented initially with optic neuritis on the right. He got much better but continues to have some blurry vision out of the right eye. About the same time, he also began to notice that his gait was a little off balance. Although the vision has pretty much been stable, the gait has worsened over the years. He also has had worsening difficulties with attention and with verbal fluency and with memory.    He also notes numbness in the left face with tingling.    He reports difficulty with fatigue and gets benefit from IV steroid.   However, over the last year, the benefit has been less and there was concern for bone loss.   Marland Kitchen  He got more benefit from Ritalin .  He did misuse it (taking higher doses in the past and then running out early) and it was discontinued a few years ago.   He was initially placed on Betaseron and switched to Plegridy to have fewer number of injections. While on these medicines he has not had any clearcut exacerbations though he has had some progression of his symptoms.   He tried to quit her but stopped it due to tolerability issues.   He switched to Ocrevus in late 2017 but stopped in 2020 due to infusion reactions.  He tried Avonex again in 2022 but felt very fatigued and stopped.  He was briefly on Aubagio but stopped in 2022.    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    The stroke was noted on the 2019 MRI and has evolved in the interim..    Multiple T2/FLAIR hypertense foci in the brainstem and hemispheres in a pattern and  configuration consistent with chronic demyelinating plaque associated with multiple sclerosis.  None of the foci enhance.  No new lesions compared to the MRI from 11/05/2017.   Chronic microhemorrhage in the right hemisphere also present in 2019.   No flow in the right internal carotid artery.   REVIEW OF SYSTEMS:  Constitutional: No fevers, chills, sweats, or change in appetite.  Has fatigue Eyes: No new visual changes, double vision, eye pain Ear, nose and throat: No hearing loss, ear pain, nasal congestion, sore throat Cardiovascular: No chest pain, palpitations Respiratory:  No shortness of breath at rest or with exertion.   No wheezes GastrointestinaI: No nausea, vomiting, diarrhea, abdominal pain, fecal incontinence Genitourinary:  See above    No nocturia. Musculoskeletal:    He continues to have back pain even after surgery.  He has right hip pain Integumentary: No rash, pruritus, skin lesions Neurological: as above Psychiatric: see above. Endocrine: No palpitations, diaphoresis, change in appetite, change in weigh or increased thirst Hematologic/Lymphatic:  No anemia, purpura, petechiae. Allergic/Immunologic: No itchy/runny eyes, nasal congestion, recent allergic reactions, rashes  ALLERGIES: Allergies  Allergen Reactions   Penicillins Rash    HOME MEDICATIONS: See Med List  PAST MEDICAL HISTORY: Past Medical History:  Diagnosis Date   Fatigue 06/24/2014   Multiple sclerosis (HCC) 06/24/2014    PAST SURGICAL HISTORY: Past Surgical History:  Procedure Laterality Date   BACK SURGERY     x2    FAMILY HISTORY: Family History  Problem Relation Age of Onset   Rheum arthritis Father    Hyperlipidemia Father    Hypertension Father    Asthma Son    Cancer Paternal Grandfather    Heart failure Son    Hypertension Mother    Thyroid disease Neg Hx    Stroke Neg Hx    Seizures Neg Hx    Rashes / Skin problems Neg Hx    Migraines Neg Hx    Diabetes Neg Hx      SOCIAL HISTORY:  Social History   Socioeconomic History   Marital status: Married    Spouse name: Boyd Kerbs   Number of children: Not on file   Years of education: Not on file   Highest education level: Not on file  Occupational History   Occupation: unemployed  Tobacco Use   Smoking status: Every Day    Packs/day: 0.50    Years: 35.00    Total pack years: 17.50    Types: Cigarettes   Smokeless  tobacco: Never  Substance and Sexual Activity   Alcohol use: Not Currently    Alcohol/week: 0.0 standard drinks of alcohol   Drug use: No   Sexual activity: Not on file  Other Topics Concern   Not on file  Social History Narrative   Caffeine use: 1-2 sodas/tea per day   Lives with wife ad ddaughter   Right handed    Social Determinants of Health   Financial Resource Strain: Not on file  Food Insecurity: Not on file  Transportation Needs: Not on file  Physical Activity: Not on file  Stress: Not on file  Social Connections: Not on file  Intimate Partner Violence: Not on file     PHYSICAL EXAM  Vitals:   02/04/22 0819  BP: (!) 143/87  Pulse: 62  Weight: 165 lb (74.8 kg)  Height: 5\' 8"  (1.727 m)    Body mass index is 25.09 kg/m.   General: The patient is well-developed and well-nourished and in no acute distress.        Neurologic Exam  Mental status: The patient is alert and oriented x 3 at the time of the examination. The patient has reduced attention span and concentration ability.   Speech is normal.  Cranial nerves: Extraocular movements are full. Strength is normal. There is reduced facial sensation on the left..  Trapezius and sternocleidomastoid strength is normal.  Voice is slurred   No obvious hearing deficits are noted.  Motor:  Muscle bulk is normal. Tone is mildly increased in the legs. Strength is  5 / 5 in all 4 extremities.   RAM now good in right hand/aem  Sensory: Sensory testing is intact to touch in the arms and legs.   Coordination:  Cerebellar testing reveals good finger-nose-finger and  reduced heel-to-shin right worse than left.  Gait and station: Station is stable.  He has a slight right foot drop improved from last visit . The tandem gait is wide.   Romberg is negative.  Reflexes: Deep tendon reflexes are increased at knees with spread, left greater than right. There is no ankle clonus..                                                                          1. Multiple sclerosis (HCC)   2. Cerebrovascular accident (CVA), unspecified mechanism (HCC)   3. Right carotid artery occlusion   4. High risk medication use   5. Other fatigue   6. Tobacco abuse      1.   He will remain off of a disease modifying therapy for the MS.  He has had no recent exacerbation.  We did discuss that we will probably want to check an MRI in 2024 and if there are new MS lesions I would want him to go back on a disease modifying therapy.. 2.   Although his fatigue did best on Ritalin, he had issues with abuse in the past with this medication and others.  Therefore, I will not write for Ritalin for his fatigue.  I will have him try modafinil again.  Additionally, I will send in a prescription for steroid pack which has given him benefit for the fatigue in the past.  We discussed that we  could probably do that about 3-4 times a year. 3.   Stay active.   Exercise as tolerated 4.   continue bASA 5.   rtc 6 months, sooner if problems.      Zelma Mazariego A. Epimenio Foot, MD, PhD 02/04/2022, 8:58 AM Certified in Neurology, Clinical Neurophysiology, Sleep Medicine, Pain Medicine and Neuroimaging  [facility] [address] [place], [place] [address] [phone]

## 2022-02-12 ENCOUNTER — Telehealth: Payer: Self-pay | Admitting: *Deleted

## 2022-02-12 NOTE — Telephone Encounter (Signed)
PA modafinil submitted on CMM. Key: [id number]. Waiting on determination from [facility].

## 2022-02-16 NOTE — Telephone Encounter (Signed)
Received fax from [employer] that PA approved until 06/13/22.  

## 2022-08-11 ENCOUNTER — Ambulatory Visit: Payer: Medicare PPO | Admitting: Neurology

## 2022-08-18 ENCOUNTER — Ambulatory Visit: Payer: Medicare PPO | Admitting: Neurology
